# Patient Record
Sex: Female | Born: 1955
Health system: Southern US, Community
[De-identification: ages and names within clinical notes are randomized; demographics above are authoritative.]

## PROBLEM LIST (undated history)

## (undated) DIAGNOSIS — C801 Malignant (primary) neoplasm, unspecified: Secondary | ICD-10-CM

## (undated) DIAGNOSIS — Z806 Family history of leukemia: Secondary | ICD-10-CM

## (undated) DIAGNOSIS — H9190 Unspecified hearing loss, unspecified ear: Secondary | ICD-10-CM

## (undated) DIAGNOSIS — R112 Nausea with vomiting, unspecified: Secondary | ICD-10-CM

## (undated) DIAGNOSIS — J45909 Unspecified asthma, uncomplicated: Secondary | ICD-10-CM

## (undated) DIAGNOSIS — T7840XA Allergy, unspecified, initial encounter: Secondary | ICD-10-CM

## (undated) DIAGNOSIS — R519 Headache, unspecified: Secondary | ICD-10-CM

## (undated) DIAGNOSIS — R7303 Prediabetes: Secondary | ICD-10-CM

## (undated) DIAGNOSIS — I10 Essential (primary) hypertension: Secondary | ICD-10-CM

## (undated) DIAGNOSIS — H409 Unspecified glaucoma: Secondary | ICD-10-CM

## (undated) DIAGNOSIS — F32A Depression, unspecified: Secondary | ICD-10-CM

## (undated) DIAGNOSIS — F329 Major depressive disorder, single episode, unspecified: Secondary | ICD-10-CM

## (undated) DIAGNOSIS — Z8051 Family history of malignant neoplasm of kidney: Secondary | ICD-10-CM

## (undated) HISTORY — DX: Family history of leukemia: Z80.6

## (undated) HISTORY — DX: Depression, unspecified: F32.A

## (undated) HISTORY — DX: Unspecified glaucoma: H40.9

## (undated) HISTORY — DX: Family history of malignant neoplasm of kidney: Z80.51

## (undated) HISTORY — PX: TUBAL LIGATION: SHX77

## (undated) HISTORY — PX: APPENDECTOMY: SHX54

## (undated) HISTORY — DX: Allergy, unspecified, initial encounter: T78.40XA

## (undated) HISTORY — PX: EYE SURGERY: SHX253

## (undated) HISTORY — PX: OTHER SURGICAL HISTORY: SHX169

## (undated) HISTORY — DX: Unspecified hearing loss, unspecified ear: H91.90

## (undated) HISTORY — PX: TONSILLECTOMY: SUR1361

## (undated) HISTORY — DX: Unspecified asthma, uncomplicated: J45.909

## (undated) HISTORY — DX: Essential (primary) hypertension: I10

---
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
## 1998-01-06 ENCOUNTER — Other Ambulatory Visit: Admission: RE | Admit: 1998-01-06 | Discharge: 1998-01-06 | Payer: Self-pay | Admitting: Obstetrics and Gynecology

## 2017-09-25 DIAGNOSIS — K080 Exfoliation of teeth due to systemic causes: Secondary | ICD-10-CM | POA: Diagnosis not present

## 2018-04-02 DIAGNOSIS — K080 Exfoliation of teeth due to systemic causes: Secondary | ICD-10-CM | POA: Diagnosis not present

## 2018-06-02 DIAGNOSIS — Z23 Encounter for immunization: Secondary | ICD-10-CM | POA: Diagnosis not present

## 2018-09-15 DIAGNOSIS — K080 Exfoliation of teeth due to systemic causes: Secondary | ICD-10-CM | POA: Diagnosis not present

## 2018-10-07 DIAGNOSIS — K080 Exfoliation of teeth due to systemic causes: Secondary | ICD-10-CM | POA: Diagnosis not present

## 2019-04-13 DIAGNOSIS — K080 Exfoliation of teeth due to systemic causes: Secondary | ICD-10-CM | POA: Diagnosis not present

## 2019-07-14 DIAGNOSIS — J309 Allergic rhinitis, unspecified: Secondary | ICD-10-CM | POA: Diagnosis not present

## 2019-07-14 DIAGNOSIS — Z23 Encounter for immunization: Secondary | ICD-10-CM | POA: Diagnosis not present

## 2019-07-14 DIAGNOSIS — I10 Essential (primary) hypertension: Secondary | ICD-10-CM | POA: Diagnosis not present

## 2019-07-14 DIAGNOSIS — N950 Postmenopausal bleeding: Secondary | ICD-10-CM | POA: Diagnosis not present

## 2019-07-15 ENCOUNTER — Other Ambulatory Visit: Payer: Self-pay | Admitting: Family Medicine

## 2019-07-15 DIAGNOSIS — N950 Postmenopausal bleeding: Secondary | ICD-10-CM

## 2019-07-24 ENCOUNTER — Other Ambulatory Visit: Payer: Self-pay | Admitting: Family Medicine

## 2019-07-24 DIAGNOSIS — N950 Postmenopausal bleeding: Secondary | ICD-10-CM

## 2019-07-30 ENCOUNTER — Ambulatory Visit
Admission: RE | Admit: 2019-07-30 | Discharge: 2019-07-30 | Disposition: A | Discharge status: Home / Self Care | Payer: Federal, State, Local not specified - PPO | Source: Ambulatory Visit | Attending: Family Medicine | Admitting: Family Medicine

## 2019-07-30 DIAGNOSIS — N950 Postmenopausal bleeding: Secondary | ICD-10-CM | POA: Diagnosis not present

## 2019-08-06 DIAGNOSIS — C541 Malignant neoplasm of endometrium: Secondary | ICD-10-CM | POA: Diagnosis not present

## 2019-08-06 DIAGNOSIS — Z124 Encounter for screening for malignant neoplasm of cervix: Secondary | ICD-10-CM | POA: Diagnosis not present

## 2019-08-06 DIAGNOSIS — N950 Postmenopausal bleeding: Secondary | ICD-10-CM | POA: Diagnosis not present

## 2019-08-12 ENCOUNTER — Telehealth: Payer: Self-pay | Admitting: *Deleted

## 2019-08-12 NOTE — Telephone Encounter (Signed)
Called and scheduled the patient for a new patient appt for 11/30. Gave the patient information for no visitors and mask

## 2019-08-14 DIAGNOSIS — C541 Malignant neoplasm of endometrium: Secondary | ICD-10-CM

## 2019-08-17 ENCOUNTER — Other Ambulatory Visit: Payer: Self-pay

## 2019-08-17 ENCOUNTER — Encounter: Payer: Self-pay | Admitting: Gynecologic Oncology

## 2019-08-17 ENCOUNTER — Inpatient Hospital Stay: Payer: Federal, State, Local not specified - PPO | Attending: Gynecologic Oncology | Admitting: Gynecologic Oncology

## 2019-08-17 VITALS — BP 178/93 | HR 78 | Temp 99.3°F | Resp 18 | Ht 70.0 in | Wt 296.0 lb

## 2019-08-17 DIAGNOSIS — Z87891 Personal history of nicotine dependence: Secondary | ICD-10-CM | POA: Diagnosis not present

## 2019-08-17 DIAGNOSIS — C541 Malignant neoplasm of endometrium: Secondary | ICD-10-CM | POA: Diagnosis not present

## 2019-08-17 DIAGNOSIS — F329 Major depressive disorder, single episode, unspecified: Secondary | ICD-10-CM | POA: Diagnosis not present

## 2019-08-17 DIAGNOSIS — N852 Hypertrophy of uterus: Secondary | ICD-10-CM | POA: Diagnosis not present

## 2019-08-17 DIAGNOSIS — I10 Essential (primary) hypertension: Secondary | ICD-10-CM | POA: Insufficient documentation

## 2019-08-17 DIAGNOSIS — J45909 Unspecified asthma, uncomplicated: Secondary | ICD-10-CM | POA: Diagnosis not present

## 2019-08-17 DIAGNOSIS — Z79899 Other long term (current) drug therapy: Secondary | ICD-10-CM | POA: Diagnosis not present

## 2019-08-17 DIAGNOSIS — Z6841 Body Mass Index (BMI) 40.0 and over, adult: Secondary | ICD-10-CM | POA: Diagnosis not present

## 2019-08-17 DIAGNOSIS — Z1211 Encounter for screening for malignant neoplasm of colon: Secondary | ICD-10-CM | POA: Diagnosis not present

## 2019-08-17 MED ORDER — TRAMADOL HCL 50 MG PO TABS: 50.0000 mg | ORAL_TABLET | Freq: Every day | ORAL | 0 refills | Status: DC | PRN

## 2019-08-17 NOTE — Patient Instructions (Signed)
Preparing for your Surgery  Plan for surgery on September 28, 2019 with Dr. Everitt Amber at [place]. You will be scheduled for a robotic assisted total laparoscopic hysterectomy, bilateral salpingo-oophorectomy, sentinel lymph node biopsy.   Pre-operative Testing -You will receive a phone call from presurgical testing at [facility] if you have not received a call already to arrange for a pre-operative testing appointment before your surgery.  This appointment normally occurs one to two weeks before your scheduled surgery.   -Bring your insurance card, copy of an advanced directive if applicable, medication list  -At that visit, you will be asked to sign a consent for a possible blood transfusion in case a transfusion becomes necessary during surgery.  The need for a blood transfusion is rare but having consent is a necessary part of your care.     -You should not be taking blood thinners, aspirin, or ibuprofen at least ten days prior to surgery unless instructed by your surgeon.  -Do not take supplements such as fish oil (omega 3), red yeast rice, tumeric before your surgery.  Day Before Surgery at [place] will be asked to take in a light diet the day before surgery.  Avoid carbonated beverages.  You will be advised to have nothing to eat or drink after midnight the evening before.    Eat a light diet the day before surgery.  Examples including soups, broths, toast, yogurt, mashed potatoes.  Things to avoid include carbonated beverages (fizzy beverages), raw fruits and raw vegetables, or beans.   If your bowels are filled with gas, your surgeon will have difficulty visualizing your pelvic organs which increases your surgical risks.  Your role in recovery Your role is to become active as soon as directed by your doctor, while still giving yourself time to heal.  Rest when you feel tired. You will be asked to do the following in order to speed your  recovery:  - Cough and breathe deeply. This helps toclear and expand your lungs and can prevent pneumonia.  - Do mild physical activity. Walking or moving your legs help your circulation and body functions return to normal. A staff member will help you when you try to walk and will provide you with simple exercises. Do not try to get up or walk alone the first time. - Actively manage your pain. Managing your pain lets you move in comfort. We will ask you to rate your pain on a scale of zero to 10. It is your responsibility to tell your doctor or nurse where and how much you hurt so your pain can be treated.  Special Considerations -If you are diabetic, you may be placed on insulin after surgery to have closer control over your blood sugars to promote healing and recovery.  This does not mean that you will be discharged on insulin.  If applicable, your oral antidiabetics will be resumed when you are tolerating a solid diet.  -Your final pathology results from surgery should be available around one week after surgery and the results will be relayed to you when available.  -FMLA forms can be faxed to [phone] and please allow 5-7 business days for completion.  Blood Transfusion Information WHAT IS A BLOOD TRANSFUSION? A transfusion is the replacement of blood or some of its parts. Blood is made up of multiple cells which provide different functions.  Red blood cells carry oxygen and are used for blood loss replacement.  White blood cells  fight against infection.  Platelets control bleeding.  Plasma helps clot blood.  Other blood products are available for specialized needs, such as hemophilia or other clotting disorders. BEFORE THE TRANSFUSION  Who gives blood for transfusions?   You may be able to donate blood to be used at a later date on yourself (autologous donation).  Relatives can be asked to donate blood. This is generally not any safer than if you have received blood from a  stranger. The same precautions are taken to ensure safety when a relative's blood is donated.  Healthy volunteers who are fully evaluated to make sure their blood is safe. This is blood bank blood. Transfusion therapy is the safest it has ever been in the practice of medicine. Before blood is taken from a donor, a complete history is taken to make sure that person has no history of diseases nor engages in risky social behavior (examples are intravenous drug use or sexual activity with multiple partners). The donor's travel history is screened to minimize risk of transmitting infections, such as malaria. The donated blood is tested for signs of infectious diseases, such as HIV and hepatitis. The blood is then tested to be sure it is compatible with you in order to minimize the chance of a transfusion reaction. If you or a relative donates blood, this is often done in anticipation of surgery and is not appropriate for emergency situations. It takes many days to process the donated blood. RISKS AND COMPLICATIONS Although transfusion therapy is very safe and saves many lives, the main dangers of transfusion include:   Getting an infectious disease.  Developing a transfusion reaction. This is an allergic reaction to something in the blood you were given. Every precaution is taken to prevent this. The decision to have a blood transfusion has been considered carefully by your caregiver before blood is given. Blood is not given unless the benefits outweigh the risks.  AFTER SURGERY INSTRUCTIONS 08/17/2019  Return to work: 4-6 weeks if applicable  Activity: 1. Be up and out of the bed during the day.  Take a nap if needed.  You may walk up steps but be careful and use the hand rail.  Stair climbing will tire you more than you think, you may need to stop part way and rest.   2. No lifting or straining for 6 weeks.  3. No driving for 1 week(s).  Do not drive if you are taking narcotic pain medicine.  4.  Shower daily.  Use soap and water on your incision and pat dry; don't rub.  No tub baths until cleared by your surgeon.   5. No sexual activity and nothing in the vagina for 8 weeks.  6. You may experience a small amount of clear drainage from your incisions, which is normal.  If the drainage persists or increases, please call the office.  7. You may experience vaginal spotting after surgery or around the 6-8 week mark from surgery when the stitches at the top of the vagina begin to dissolve.  The spotting is normal but if you experience heavy bleeding, call our office.  8. Take Tylenol or ibuprofen first for pain and only use narcotic pain medication for severe pain not relieved by the Tylenol or Ibuprofen.  Monitor your Tylenol intake to a max of 4,000 mg.  Diet: 1. Low sodium Heart Healthy Diet is recommended.  2. It is safe to use a laxative, such as Miralax or Colace, if you have difficulty moving your  bowels. You can take Sennakot at bedtime every evening to keep bowel movements regular and to prevent constipation.    Wound Care: 1. Keep clean and dry.  Shower daily.  Reasons to call the Doctor:  Fever - Oral temperature greater than 100.4 degrees Fahrenheit  Foul-smelling vaginal discharge  Difficulty urinating  Nausea and vomiting  Increased pain at the site of the incision that is unrelieved with pain medicine.  Difficulty breathing with or without chest pain  New calf pain especially if only on one side  Sudden, continuing increased vaginal bleeding with or without clots.   Contacts: For questions or concerns you should contact:  Dr. Everitt Amber at [phone]  Joylene John, NP at [phone]  After Hours: call [phone] and have the GYN Oncologist paged/contacted

## 2019-08-17 NOTE — Addendum Note (Signed)
Addended by: Joylene John D on: 08/17/2019 12:19 PM   Modules accepted: Orders

## 2019-08-17 NOTE — Progress Notes (Signed)
Consult Note: Gyn-Onc  Consult was requested by Dr. Nelda Marseille for the evaluation of Sandra Hunt 63 y.o. female  CC:  Chief Complaint  Patient presents with  . Endometrial cancer [facility])    Assessment/Plan:  Sandra Hunt  is a 63 y.o.  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 She also has an enlarged uterus (11cm).  A detailed discussion was held with the patient and her family with regard to to her endometrial cancer diagnosis. We discussed the standard management options for uterine cancer which includes surgery followed possibly by adjuvant therapy depending on the results of surgery. The options for surgical management include a hysterectomy and removal of the tubes and ovaries possibly with removal of pelvic and para-aortic lymph nodes.If feasible, a minimally invasive approach including a robotic hysterectomy or laparoscopic hysterectomy have benefits including shorter hospital stay, recovery time and better wound healing than with open surgery. The patient has been counseled about these surgical options and the risks of surgery in general including infection, bleeding, damage to surrounding structures (including bowel, bladder, ureters, nerves or vessels), and the postoperative risks of PE/ DVT, and lymphedema. I extensively reviewed the additional risks of robotic hysterectomy including possible need for conversion to open laparotomy.  I discussed positioning during surgery of trendelenberg and risks of minor facial swelling and care we take in preoperative positioning.  After counseling and consideration of her options, she desires to proceed with robotic assisted total hysterectomy with bilateral sapingo-oophorectomy and SLN biopsy.   She may require a minilaparotomy for specimen delivery. We discussed this.  She will be seen by anesthesia for preoperative clearance and discussion of postoperative pain management.  She was  given the opportunity to ask questions, which were answered to her satisfaction, and she is agreement with the above mentioned plan of care.   HPI: Sandra Hunt is a 63 year old P4 who was seen in consultation at the request of Dr Nelda Marseille for evaluation of endometrial adenocarcinoma (FIGO grade 2).    The patient reported having menstrual-like cyclical bleeding for approximately 2 years beginning in 2018.  In April 2020 she then began passing heavy clots for approximately 3 months.  With subsequent lighter vaginal persistent daily bleeding.  She was eventually seen by Dr. Nelda Marseille who performed a transvaginal ultrasound scan on July 30, 2019 which revealed a uterus measuring 11.6 x 7.2 x 6.7 cm.  The endometrium was 6 mm in thickness.  The right and left ovaries were grossly normal.  Due to the endometrial thickening she underwent an office transvaginal biopsy on August 06, 2019.  This revealed FIGO grade 2 endometrioid adenocarcinoma.  The patient's obstetric history significant for 4 prior vaginal births.  She has had a tubal ligation and a diagnostic laparoscopy for what was thought to be a possible ectopic but was not.  Additionally her surgical history is remarkable for an open appendectomy.  She is morbidly obese with a BMI of 42 kg meters squared.  She has hypertension but no diabetes mellitus.  She is an ex-smoker but quit in 1983.  She has a family history significant for father with leukemia and a mother with kidney cancer.  She is married and lives with her husband who is of good health.  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  She helps with her brother and her mother with activities of daily living.  Current Meds:  Outpatient Encounter Medications as of 08/17/2019  Medication Sig  . acetaminophen (TYLENOL) 325 MG tablet Take 650 mg by mouth every 6 (six) hours as needed.  Marland Kitchen ibuprofen (ADVIL) 100 MG tablet Take 600 mg by mouth every 6 (six) hours as needed  for fever.  Marland Kitchen lisinopril (ZESTRIL) 10 MG tablet Take 10 mg by mouth daily.  Marland Kitchen PROAIR RESPICLICK 123XX123 (90 Base) MCG/ACT AEPB Inhale 1 puff into the lungs every 4 (four) hours as needed.  . traMADol (ULTRAM) 50 MG tablet Take 50 mg by mouth daily as needed.   No facility-administered encounter medications on file as of 08/17/2019.     Allergy:  Allergies  Allergen Reactions  . Sulfacetamide Sodium-Sulfur Rash    Social Hx:   Social History   Socioeconomic History  . Marital status: Single    Spouse name: Not on file  . Number of children: Not on file  . Years of education: Not on file  . Highest education level: Not on file  Occupational History  . Not on file  Social Needs  . Financial resource strain: Not on file  . Food insecurity    Worry: Not on file    Inability: Not on file  . Transportation needs    Medical: Not on file    Non-medical: Not on file  Tobacco Use  . Smoking status: Former Smoker    Quit date: 1983    Years since quitting: 37.9  . Smokeless tobacco: Never Used  Substance and Sexual Activity  . Alcohol use: Not Currently  . Drug use: Never  . Sexual activity: Yes    Birth control/protection: Other-see comments    Comment: tubal ligation  Lifestyle  . Physical activity    Days per week: Not on file    Minutes per session: Not on file  . Stress: Not on file  Relationships  . Social Herbalist on phone: Not on file    Gets together: Not on file    Attends religious service: Not on file    Active member of club or organization: Not on file    Attends meetings of clubs or organizations: Not on file    Relationship status: Not on file  . Intimate partner violence    Fear of current or ex partner: Not on file    Emotionally abused: Not on file    Physically abused: Not on file    Forced sexual activity: Not on file  Other Topics Concern  . Not on file  Social History Narrative  . Not on file    Past Surgical Hx:  Past Surgical  History:  Procedure Laterality Date  . APPENDECTOMY    . laporoscopy N/A   . TONSILLECTOMY Bilateral   . TUBAL LIGATION      Past Medical Hx:  Past Medical History:  Diagnosis Date  . Allergy   . Asthma   . Depression   . Glaucoma   . Hearing loss   . Hypertension     Past Gynecological History:  See HPI No LMP recorded.  Family Hx:  Family History  Problem Relation Age of Onset  . Leukemia Father   . Diabetes Father   . Diabetes Maternal Grandmother     Review of Systems:  Constitutional  Feels well,    ENT Normal appearing ears and nares bilaterally Skin/Breast  No rash, sores, jaundice, itching, dryness Cardiovascular  No chest pain, shortness of breath, or edema  Pulmonary  No cough or wheeze.  Gastro Intestinal  No nausea, vomitting, or diarrhoea. No bright red blood per rectum, no abdominal pain, change in bowel movement, or constipation.  Genito Urinary  No frequency, urgency, dysuria, + postmenopausal bleeding and uterine cramps Musculo Skeletal  No myalgia, arthralgia, joint swelling or pain  Neurologic  No weakness, numbness, change in gait,  Psychology  No depression, anxiety, insomnia.   Vitals:  Blood pressure (!) 178/93, pulse 78, temperature 99.3 F (37.4 C), temperature source Temporal, resp. rate 18, height 5\' 10"  (1.778 m), weight 296 lb (134.3 kg), SpO2 98 %.  Physical Exam: WD in NAD Neck  Supple NROM, without any enlargements.  Lymph Node Survey No cervical supraclavicular or inguinal adenopathy Cardiovascular  Pulse normal rate, regularity and rhythm. S1 and S2 normal.  Lungs  Clear to auscultation bilateraly, without wheezes/crackles/rhonchi. Good air movement.  Skin  No rash/lesions/breakdown  Psychiatry  Alert and oriented to person, place, and time  Abdomen  Normoactive bowel sounds, abdomen soft, non-tender and obese without evidence of hernia.  Back No CVA tenderness Genito Urinary  Vulva/vagina: Normal external  female genitalia.   No lesions. No discharge or bleeding.  Bladder/urethra:  No lesions or masses, well supported bladder  Vagina: grossly normal  Cervix: flush with cervix, lower uterine segment feels barrelled.   Uterus: Bulky, mobile, bulky widened lower uterine segment,no parametrial involvement or nodularity.  Adnexa: no palpable masses. Rectal  deferred Extremities  No bilateral cyanosis, clubbing or edema.   Thereasa Solo, MD  08/17/2019, 11:19 AM

## 2019-08-18 DIAGNOSIS — Z1211 Encounter for screening for malignant neoplasm of colon: Secondary | ICD-10-CM | POA: Diagnosis not present

## 2019-08-19 ENCOUNTER — Other Ambulatory Visit: Payer: Self-pay | Admitting: Gynecologic Oncology

## 2019-08-19 DIAGNOSIS — C541 Malignant neoplasm of endometrium: Secondary | ICD-10-CM

## 2019-09-14 ENCOUNTER — Telehealth: Payer: Self-pay | Admitting: *Deleted

## 2019-09-14 NOTE — Telephone Encounter (Signed)
Patient called regarding her COVID test, patient thought the test was two weeks before surgery along with a two week quarantine. Explained that she will receive a call the week before surgery for the COVID test and to quarantine after the test

## 2019-09-15 ENCOUNTER — Telehealth: Payer: Self-pay

## 2019-09-15 ENCOUNTER — Encounter: Payer: Self-pay | Admitting: Gynecologic Oncology

## 2019-09-15 NOTE — Telephone Encounter (Signed)
I called Sandra Hunt in response to her my chart message to Dr. Denman George.  She is having cramping, "like having a baby" with bleeding that waxes and wanes.  This is  cramping is the same in character as she had before her consultation with Dr. Denman George.  She is voiding without any s/s of UTI.  She is moving her bowels without difficulty.  She has been hesitant to take ibuprofen because of surgery.  Per Lenna Sciara, NP she can take the ibuprofen every 6 hours as needed until this coming Friday.  I encouraged her to use a heating pad to help with the cramping.  I told her she could go to the emergency room if the pain did not improve or got worse.  She verbalized understanding.

## 2019-09-21 ENCOUNTER — Other Ambulatory Visit (HOSPITAL_COMMUNITY): Payer: Self-pay | Admitting: *Deleted

## 2019-09-21 NOTE — Patient Instructions (Addendum)
DUE TO COVID-19 ONLY ONE VISITOR IS ALLOWED TO COME WITH YOU AND STAY IN THE WAITING ROOM ONLY DURING PRE OP AND PROCEDURE DAY OF SURGERY. THE 1 VISITOR MAY VISIT WITH YOU AFTER SURGERY IN YOUR PRIVATE ROOM DURING VISITING HOURS ONLY!  YOU NEED TO HAVE A COVID 19 TEST ON__Thursday 01/07/2021_____ @_1010  am______, THIS TEST MUST BE DONE BEFORE SURGERY, COME  [name] , [id number].  ([place]) ONCE YOUR COVID TEST IS COMPLETED, PLEASE BEGIN THE QUARANTINE INSTRUCTIONS AS OUTLINED IN YOUR HANDOUT.                Sandra Hunt     Your procedure is scheduled on: Monday 09/28/2019   Report to [facility] Main  Entrance    Report to admitting at   0700 AM     Call this number if you have problems the morning of surgery [phone]    Eat a light diet the day before surgery.  Examples including soups, broths, toast, yogurt, mashed potatoes.  Things to avoid include carbonated beverages (fizzy beverages), raw fruits and raw vegetables, or beans.    If your bowels are filled with gas, your surgeon will have difficulty visualizing your pelvic organs which increases your surgical risks.    You May have clear liquids from midnight up until 0600am then nothing until after surgery.   CLEAR LIQUID DIET   Foods Allowed                                                                     Foods Excluded  Coffee and tea, regular and decaf                             liquids that you cannot  Plain Jell-O any favor except red or purple                                           see through such as: Fruit ices (not with fruit pulp)                                     milk, soups, orange juice  Iced Popsicles                                    All solid food                                     Cranberry, grape and apple juices Sports drinks like Gatorade Lightly seasoned clear broth or consume(fat free) Sugar, honey syrup  Sample Menu Breakfast                                 Lunch  Supper Cranberry juice                    Beef broth                            Chicken broth Jell-O                                     Grape juice                           Apple juice Coffee or tea                        Jell-O                                      Popsicle                                                Coffee or tea                        Coffee or tea  _____________________________________________________________________     BRUSH YOUR TEETH MORNING OF SURGERY AND RINSE YOUR MOUTH OUT, NO CHEWING GUM CANDY OR MINTS.     Take these medicines the morning of surgery with A SIP OF WATER: use Proair Respiclick inhaler if needed and bring inhalers with you to the hospital.                                 You may not have any metal on your body including hair pins and              piercings  Do not wear jewelry, make-up, lotions, powders or perfumes, deodorant             Do not wear nail polish on your fingernails.  Do not shave  48 hours prior to surgery.                 Do not bring valuables to the hospital. [place].  Contacts, dentures or bridgework may not be worn into surgery.  Leave suitcase in the car. After surgery it may be brought to your room.     Patients discharged the day of surgery will not be allowed to drive home. IF YOU ARE HAVING SURGERY AND GOING HOME THE SAME DAY, YOU MUST HAVE AN ADULT TO DRIVE YOU HOME AND  BE WITH YOU FOR 24 HOURS. YOU MAY GO HOME BY TAXI OR UBER OR ORTHERWISE, BUT AN ADULT MUST ACCOMPANY YOU HOME AND STAY WITH YOU FOR 24 HOURS.  Name and phone number of your driver:spouseChristia Hunt  [phone]                Please read over the following fact sheets you were given: _____________________________________________________________________  [facility] - Preparing for Surgery Before surgery,  you can play an important role.  Because skin is not sterile, your skin needs to be as free of germs as possible.  You can reduce the number of germs on your skin by washing with CHG (chlorahexidine gluconate) soap before surgery.  CHG is an antiseptic cleaner which kills germs and bonds with the skin to continue killing germs even after washing. Please DO NOT use if you have an allergy to CHG or antibacterial soaps.  If your skin becomes reddened/irritated stop using the CHG and inform your nurse when you arrive at Short Stay. Do not shave (including legs and underarms) for at least 48 hours prior to the first CHG shower.  You may shave your face/neck. Please follow these instructions carefully:  1.  Shower with CHG Soap the night before surgery and the  morning of Surgery.  2.  If you choose to wash your hair, wash your hair first as usual with your  normal  shampoo.  3.  After you shampoo, rinse your hair and body thoroughly to remove the  shampoo.                           4.  Use CHG as you would any other liquid soap.  You can apply chg directly  to the skin and wash                       Gently with a scrungie or clean washcloth.  5.  Apply the CHG Soap to your body ONLY FROM THE NECK DOWN.   Do not use on face/ open                           Wound or open sores. Avoid contact with eyes, ears mouth and genitals (private parts).                       Wash face,  Genitals (private parts) with your normal soap.             6.  Wash thoroughly, paying special attention to the area where your surgery  will be performed.  7.  Thoroughly rinse your body with warm water from the neck down.  8.  DO NOT shower/wash with your normal soap after using and rinsing off  the CHG Soap.                9.  Pat yourself dry with a clean towel.            10.  Wear clean pajamas.            11.  Place clean sheets on your bed the night of your first shower and do not  sleep with pets. Day of Surgery : Do not  apply any lotions/deodorants the morning of surgery.  Please wear clean clothes to the hospital/surgery center.  FAILURE TO FOLLOW THESE INSTRUCTIONS MAY RESULT IN THE CANCELLATION OF YOUR SURGERY PATIENT SIGNATURE_________________________________  NURSE SIGNATURE__________________________________  ________________________________________________________________________   Adam Phenix  An incentive spirometer is a tool that can help keep your lungs clear and active. This tool measures how well you are filling your lungs with each breath. Taking long deep breaths may help reverse or decrease the chance of developing breathing (pulmonary) problems (especially infection) following:  A long period of  time when you are unable to move or be active. BEFORE THE PROCEDURE   If the spirometer includes an indicator to show your best effort, your nurse or respiratory therapist will set it to a desired goal.  If possible, sit up straight or lean slightly forward. Try not to slouch.  Hold the incentive spirometer in an upright position. INSTRUCTIONS FOR USE  1. Sit on the edge of your bed if possible, or sit up as far as you can in bed or on a chair. 2. Hold the incentive spirometer in an upright position. 3. Breathe out normally. 4. Place the mouthpiece in your mouth and seal your lips tightly around it. 5. Breathe in slowly and as deeply as possible, raising the piston or the ball toward the top of the column. 6. Hold your breath for 3-5 seconds or for as long as possible. Allow the piston or ball to fall to the bottom of the column. 7. Remove the mouthpiece from your mouth and breathe out normally. 8. Rest for a few seconds and repeat Steps 1 through 7 at least 10 times every 1-2 hours when you are awake. Take your time and take a few normal breaths between deep breaths. 9. The spirometer may include an indicator to show your best effort. Use the indicator as a goal to work toward during  each repetition. 10. After each set of 10 deep breaths, practice coughing to be sure your lungs are clear. If you have an incision (the cut made at the time of surgery), support your incision when coughing by placing a pillow or rolled up towels firmly against it. Once you are able to get out of bed, walk around indoors and cough well. You may stop using the incentive spirometer when instructed by your caregiver.  RISKS AND COMPLICATIONS  Take your time so you do not get dizzy or light-headed.  If you are in pain, you may need to take or ask for pain medication before doing incentive spirometry. It is harder to take a deep breath if you are having pain. AFTER USE  Rest and breathe slowly and easily.  It can be helpful to keep track of a log of your progress. Your caregiver can provide you with a simple table to help with this. If you are using the spirometer at home, follow these instructions: [place] IF:   You are having difficultly using the spirometer.  You have trouble using the spirometer as often as instructed.  Your pain medication is not giving enough relief while using the spirometer.  You develop fever of 100.5 F (38.1 C) or higher. SEEK IMMEDIATE MEDICAL CARE IF:   You cough up bloody sputum that had not been present before.  You develop fever of 102 F (38.9 C) or greater.  You develop worsening pain at or near the incision site. MAKE SURE YOU:   Understand these instructions.  Will watch your condition.  Will get help right away if you are not doing well or get worse. Document Released: 01/14/2007 Document Revised: 11/26/2011 Document Reviewed: 03/17/2007 Exit[employer] Patient Information 2014 [employer], [place].   ________________________________________________________________________  WHAT IS A BLOOD TRANSFUSION? Blood Transfusion Information  A transfusion is the replacement of blood or some of its parts. Blood is made up of multiple cells which  provide different functions.  Red blood cells carry oxygen and are used for blood loss replacement.  White blood cells fight against infection.  Platelets control bleeding.  Plasma helps clot blood.  Other blood products are available for specialized needs, such as hemophilia or other clotting disorders. BEFORE THE TRANSFUSION  Who gives blood for transfusions?   Healthy volunteers who are fully evaluated to make sure their blood is safe. This is blood bank blood. Transfusion therapy is the safest it has ever been in the practice of medicine. Before blood is taken from a donor, a complete history is taken to make sure that person has no history of diseases nor engages in risky social behavior (examples are intravenous drug use or sexual activity with multiple partners). The donor's travel history is screened to minimize risk of transmitting infections, such as malaria. The donated blood is tested for signs of infectious diseases, such as HIV and hepatitis. The blood is then tested to be sure it is compatible with you in order to minimize the chance of a transfusion reaction. If you or a relative donates blood, this is often done in anticipation of surgery and is not appropriate for emergency situations. It takes many days to process the donated blood. RISKS AND COMPLICATIONS Although transfusion therapy is very safe and saves many lives, the main dangers of transfusion include:   Getting an infectious disease.  Developing a transfusion reaction. This is an allergic reaction to something in the blood you were given. Every precaution is taken to prevent this. The decision to have a blood transfusion has been considered carefully by your caregiver before blood is given. Blood is not given unless the benefits outweigh the risks. AFTER THE TRANSFUSION  Right after receiving a blood transfusion, you will usually feel much better and more energetic. This is especially true if your red blood cells  have gotten low (anemic). The transfusion raises the level of the red blood cells which carry oxygen, and this usually causes an energy increase.  The nurse administering the transfusion will monitor you carefully for complications. HOME CARE INSTRUCTIONS  No special instructions are needed after a transfusion. You may find your energy is better. Speak with your caregiver about any limitations on activity for underlying diseases you may have. SEEK MEDICAL CARE IF:   Your condition is not improving after your transfusion.  You develop redness or irritation at the intravenous (IV) site. SEEK IMMEDIATE MEDICAL CARE IF:  Any of the following symptoms occur over the next 12 hours:  Shaking chills.  You have a temperature by mouth above 102 F (38.9 C), not controlled by medicine.  Chest, back, or muscle pain.  People around you feel you are not acting correctly or are confused.  Shortness of breath or difficulty breathing.  Dizziness and fainting.  You get a rash or develop hives.  You have a decrease in urine output.  Your urine turns a dark color or changes to pink, red, or brown. Any of the following symptoms occur over the next 10 days:  You have a temperature by mouth above 102 F (38.9 C), not controlled by medicine.  Shortness of breath.  Weakness after normal activity.  The white part of the eye turns yellow (jaundice).  You have a decrease in the amount of urine or are urinating less often.  Your urine turns a dark color or changes to pink, red, or brown. Document Released: 08/31/2000 Document Revised: 11/26/2011 Document Reviewed: 04/19/2008 [facility] Patient Information 2014 [place], [place].  _______________________________________________________________________

## 2019-09-22 ENCOUNTER — Other Ambulatory Visit: Payer: Self-pay

## 2019-09-22 ENCOUNTER — Encounter (HOSPITAL_COMMUNITY)
Admission: RE | Admit: 2019-09-22 | Discharge: 2019-09-22 | Disposition: A | Discharge status: Home / Self Care | Payer: Federal, State, Local not specified - PPO | Source: Ambulatory Visit | Attending: Gynecologic Oncology | Admitting: Gynecologic Oncology

## 2019-09-22 ENCOUNTER — Encounter (HOSPITAL_COMMUNITY): Payer: Self-pay

## 2019-09-22 DIAGNOSIS — I10 Essential (primary) hypertension: Secondary | ICD-10-CM | POA: Diagnosis not present

## 2019-09-22 DIAGNOSIS — Z01812 Encounter for preprocedural laboratory examination: Secondary | ICD-10-CM | POA: Insufficient documentation

## 2019-09-22 DIAGNOSIS — Z01818 Encounter for other preprocedural examination: Secondary | ICD-10-CM | POA: Insufficient documentation

## 2019-09-22 DIAGNOSIS — R7303 Prediabetes: Secondary | ICD-10-CM | POA: Diagnosis not present

## 2019-09-22 HISTORY — DX: Headache, unspecified: R51.9

## 2019-09-22 HISTORY — DX: Malignant (primary) neoplasm, unspecified: C80.1

## 2019-09-22 HISTORY — DX: Prediabetes: R73.03

## 2019-09-22 HISTORY — DX: Other specified postprocedural states: R11.2

## 2019-09-22 LAB — COMPREHENSIVE METABOLIC PANEL
ALT: 35 U/L (ref 0–44)
AST: 38 U/L (ref 15–41)
Albumin: 4.6 g/dL (ref 3.5–5.0)
Alkaline Phosphatase: 57 U/L (ref 38–126)
Anion gap: 13 (ref 5–15)
BUN: 17 mg/dL (ref 8–23)
CO2: 24 mmol/L (ref 22–32)
Calcium: 9.5 mg/dL (ref 8.9–10.3)
Chloride: 102 mmol/L (ref 98–111)
Creatinine, Ser: 1.09 mg/dL — ABNORMAL HIGH (ref 0.44–1.00)
GFR calc Af Amer: >60 mL/min (ref >60)
GFR calc non Af Amer: 55 mL/min — ABNORMAL LOW (ref >60)
Glucose, Bld: 149 mg/dL — ABNORMAL HIGH (ref 70–99)
Potassium: 3.9 mmol/L (ref 3.5–5.1)
Sodium: 139 mmol/L (ref 135–145)
Total Bilirubin: 0.8 mg/dL (ref 0.3–1.2)
Total Protein: 7.8 g/dL (ref 6.5–8.1)

## 2019-09-22 LAB — CBC
HCT: 44.5 % (ref 36.0–46.0)
Hemoglobin: 14.2 g/dL (ref 12.0–15.0)
MCH: 30.9 pg (ref 26.0–34.0)
MCHC: 31.9 g/dL (ref 30.0–36.0)
MCV: 96.7 fL (ref 80.0–100.0)
Platelets: 377 10*3/uL (ref 150–400)
RBC: 4.6 MIL/uL (ref 3.87–5.11)
RDW: 14.6 % (ref 11.5–15.5)
WBC: 8.6 10*3/uL (ref 4.0–10.5)
nRBC: 0 % (ref 0.0–0.2)

## 2019-09-22 LAB — ABO/RH: ABO/RH(D): AB POS

## 2019-09-22 LAB — HEMOGLOBIN A1C
Hgb A1c MFr Bld: 5.7 % — ABNORMAL HIGH (ref 4.8–5.6)
Mean Plasma Glucose: 116.89 mg/dL

## 2019-09-22 NOTE — Progress Notes (Signed)
PCP - Dr. Donald Prose Cardiologist - n/a  Chest x-ray - n/a EKG - 09/22/2019 EPIC Stress Test - n/a ECHO - n/a Cardiac Cath - n/a  Sleep Study - n/a CPAP - n/a  Fasting Blood Sugar - Pre-Diabetes Checks Blood Sugar __0___ times a day  Blood Thinner Instructions:n/a Aspirin Instructions:n/a Last Dose:Takes Advil as needed and Last dose was 09/17/2019.  Anesthesia review:  Chart to Konrad Felix, PA to review medical history and see patient per Dr. Everitt Amber and talk about pain control after surgery.  Patient has a history of HTN, Pre-Diabetes and Endometrial cancer.  Patient denies shortness of breath, fever, cough and chest pain at PAT appointment   Patient verbalized understanding of instructions that were given to them at the PAT appointment. Patient was also instructed that they will need to review over the PAT instructions again at home before surgery.

## 2019-09-23 ENCOUNTER — Encounter (HOSPITAL_COMMUNITY): Payer: Self-pay

## 2019-09-23 NOTE — Progress Notes (Signed)
Anesthesia Chart Review  Pt requested to speak with anesthesia during PAT visit.  She reports she has experienced severe post op nausea and vomiting post operatively in the past and is concerned about PONV with this procedure.  She reports n/v with opioids.  She will discuss with anesthesia DOS as well.  I have added this to anesthesia history.   Maia Plan [facility] Pre-Surgical Testing [phone] 09/23/19  3:32 PM

## 2019-09-24 ENCOUNTER — Other Ambulatory Visit (HOSPITAL_COMMUNITY)
Admission: RE | Admit: 2019-09-24 | Discharge: 2019-09-24 | Disposition: A | Discharge status: Home / Self Care | Payer: Federal, State, Local not specified - PPO | Source: Ambulatory Visit | Attending: Gynecologic Oncology | Admitting: Gynecologic Oncology

## 2019-09-24 DIAGNOSIS — Z01812 Encounter for preprocedural laboratory examination: Secondary | ICD-10-CM | POA: Diagnosis not present

## 2019-09-24 DIAGNOSIS — Z20822 Contact with and (suspected) exposure to covid-19: Secondary | ICD-10-CM | POA: Insufficient documentation

## 2019-09-25 ENCOUNTER — Telehealth: Payer: Self-pay

## 2019-09-25 NOTE — Progress Notes (Signed)
Louise from GYN Oncology called to verify the time of surgery, as off 09/25/2019 1500 the surgery was scheduled for 09/28/2019 at 9:00 AM.

## 2019-09-25 NOTE — Telephone Encounter (Signed)
Spoke with Ms Fero and she understands that she needs to report to admitting on 09-28-19 at 0700 as surgery is at [place], [place].

## 2019-09-26 LAB — NOVEL CORONAVIRUS, NAA (HOSP ORDER, SEND-OUT TO REF LAB; TAT 18-24 HRS): SARS-CoV-2, NAA: NOT DETECTED

## 2019-09-27 MED ORDER — DEXTROSE 5 % IV SOLN
3.0000 g | INTRAVENOUS | Status: AC
  Administered 2019-09-28: 3 g via INTRAVENOUS
  Filled 2019-09-27: qty 3

## 2019-09-27 NOTE — Anesthesia Preprocedure Evaluation (Addendum)
Anesthesia Evaluation  Patient identified by MRN, date of birth, ID band Patient awake    Reviewed: Allergy & Precautions, NPO status , Patient's Chart, lab work & pertinent test results  History of Anesthesia Complications (+) PONV  Airway Mallampati: II  TM Distance: >3 FB Neck ROM: Full    Dental no notable dental hx. (+) Chipped, Dental Advisory Given,    Pulmonary asthma , former smoker,    Pulmonary exam normal breath sounds clear to auscultation       Cardiovascular hypertension, negative cardio ROS Normal cardiovascular exam Rhythm:Regular Rate:Normal     Neuro/Psych  Headaches, PSYCHIATRIC DISORDERS Depression    GI/Hepatic negative GI ROS, Neg liver ROS,   Endo/Other  Morbid obesity  Renal/GU negative Renal ROS  negative genitourinary   Musculoskeletal negative musculoskeletal ROS (+)   Abdominal   Peds  Hematology negative hematology ROS (+)   Anesthesia Other Findings Endometrial CA  Reproductive/Obstetrics                            Anesthesia Physical Anesthesia Plan  ASA: III  Anesthesia Plan: General   Post-op Pain Management:    Induction: Intravenous  PONV Risk Score and Plan: 4 or greater and Midazolam, Dexamethasone, Ondansetron and Scopolamine patch - Pre-op  Airway Management Planned: Oral ETT  Additional Equipment:   Intra-op Plan:   Post-operative Plan: Extubation in OR  Informed Consent: I have reviewed the patients History and Physical, chart, labs and discussed the procedure including the risks, benefits and alternatives for the proposed anesthesia with the patient or authorized representative who has indicated his/her understanding and acceptance.     Dental advisory given  Plan Discussed with: CRNA  Anesthesia Plan Comments:         Anesthesia Quick Evaluation

## 2019-09-28 ENCOUNTER — Ambulatory Visit (HOSPITAL_COMMUNITY)
Admission: RE | Admit: 2019-09-28 | Discharge: 2019-09-28 | Disposition: A | Discharge status: Home / Self Care | Payer: Federal, State, Local not specified - PPO | Source: Other Acute Inpatient Hospital | Attending: Gynecologic Oncology | Admitting: Gynecologic Oncology

## 2019-09-28 ENCOUNTER — Encounter (HOSPITAL_COMMUNITY)
Admission: RE | Disposition: A | Discharge status: Home / Self Care | Payer: Self-pay | Source: Other Acute Inpatient Hospital | Attending: Gynecologic Oncology

## 2019-09-28 ENCOUNTER — Other Ambulatory Visit: Payer: Self-pay

## 2019-09-28 ENCOUNTER — Encounter (HOSPITAL_COMMUNITY): Payer: Self-pay | Admitting: Gynecologic Oncology

## 2019-09-28 ENCOUNTER — Ambulatory Visit (HOSPITAL_COMMUNITY): Payer: Federal, State, Local not specified - PPO | Admitting: Anesthesiology

## 2019-09-28 ENCOUNTER — Ambulatory Visit (HOSPITAL_COMMUNITY): Payer: Federal, State, Local not specified - PPO | Admitting: Physician Assistant

## 2019-09-28 DIAGNOSIS — Z87891 Personal history of nicotine dependence: Secondary | ICD-10-CM | POA: Diagnosis not present

## 2019-09-28 DIAGNOSIS — N800 Endometriosis of uterus: Secondary | ICD-10-CM | POA: Insufficient documentation

## 2019-09-28 DIAGNOSIS — C541 Malignant neoplasm of endometrium: Secondary | ICD-10-CM | POA: Insufficient documentation

## 2019-09-28 DIAGNOSIS — Z6841 Body Mass Index (BMI) 40.0 and over, adult: Secondary | ICD-10-CM | POA: Insufficient documentation

## 2019-09-28 DIAGNOSIS — Z8051 Family history of malignant neoplasm of kidney: Secondary | ICD-10-CM | POA: Diagnosis not present

## 2019-09-28 DIAGNOSIS — I10 Essential (primary) hypertension: Secondary | ICD-10-CM | POA: Insufficient documentation

## 2019-09-28 DIAGNOSIS — Z806 Family history of leukemia: Secondary | ICD-10-CM | POA: Diagnosis not present

## 2019-09-28 DIAGNOSIS — J45909 Unspecified asthma, uncomplicated: Secondary | ICD-10-CM | POA: Insufficient documentation

## 2019-09-28 DIAGNOSIS — F329 Major depressive disorder, single episode, unspecified: Secondary | ICD-10-CM | POA: Diagnosis not present

## 2019-09-28 HISTORY — PX: SENTINEL NODE BIOPSY: SHX6608

## 2019-09-28 HISTORY — PX: ROBOTIC ASSISTED TOTAL HYSTERECTOMY WITH BILATERAL SALPINGO OOPHERECTOMY: SHX6086

## 2019-09-28 LAB — GLUCOSE, CAPILLARY
Glucose-Capillary: 130 mg/dL — ABNORMAL HIGH (ref 70–99)
Glucose-Capillary: 148 mg/dL — ABNORMAL HIGH (ref 70–99)

## 2019-09-28 LAB — TYPE AND SCREEN
ABO/RH(D): AB POS
Antibody Screen: NEGATIVE

## 2019-09-28 SURGERY — HYSTERECTOMY, TOTAL, ROBOT-ASSISTED, LAPAROSCOPIC, WITH BILATERAL SALPINGO-OOPHORECTOMY
Anesthesia: General

## 2019-09-28 MED ORDER — KETAMINE HCL 10 MG/ML IJ SOLN
INTRAMUSCULAR | Status: AC
  Filled 2019-09-28: qty 1

## 2019-09-28 MED ORDER — DEXAMETHASONE SODIUM PHOSPHATE 4 MG/ML IJ SOLN: 4.0000 mg | INTRAMUSCULAR | Status: DC

## 2019-09-28 MED ORDER — LIDOCAINE 2% (20 MG/ML) 5 ML SYRINGE
INTRAMUSCULAR | Status: DC | PRN
  Administered 2019-09-28: 100 mg via INTRAVENOUS

## 2019-09-28 MED ORDER — LIDOCAINE 2% (20 MG/ML) 5 ML SYRINGE
INTRAMUSCULAR | Status: AC
  Filled 2019-09-28: qty 5

## 2019-09-28 MED ORDER — LIDOCAINE HCL 2 % IJ SOLN
INTRAMUSCULAR | Status: AC
  Filled 2019-09-28: qty 20

## 2019-09-28 MED ORDER — LACTATED RINGERS IV SOLN: INTRAVENOUS | Status: DC

## 2019-09-28 MED ORDER — FENTANYL CITRATE (PF) 100 MCG/2ML IJ SOLN
INTRAMUSCULAR | Status: AC
  Filled 2019-09-28: qty 2

## 2019-09-28 MED ORDER — ROCURONIUM BROMIDE 10 MG/ML (PF) SYRINGE
PREFILLED_SYRINGE | INTRAVENOUS | Status: AC
  Filled 2019-09-28: qty 10

## 2019-09-28 MED ORDER — BUPIVACAINE HCL (PF) 0.25 % IJ SOLN
INTRAMUSCULAR | Status: AC
  Filled 2019-09-28: qty 30

## 2019-09-28 MED ORDER — BUPIVACAINE LIPOSOME 1.3 % IJ SUSP
20.0000 mL | Freq: Once | INTRAMUSCULAR | Status: AC
  Administered 2019-09-28: 11:00:00 20 mL
  Filled 2019-09-28: qty 20

## 2019-09-28 MED ORDER — KETAMINE HCL 10 MG/ML IJ SOLN
INTRAMUSCULAR | Status: DC | PRN
  Administered 2019-09-28: 40 mg via INTRAVENOUS

## 2019-09-28 MED ORDER — PROPOFOL 10 MG/ML IV BOLUS
INTRAVENOUS | Status: DC | PRN
  Administered 2019-09-28: 130 mg via INTRAVENOUS

## 2019-09-28 MED ORDER — SODIUM CHLORIDE (PF) 0.9 % IJ SOLN
INTRAMUSCULAR | Status: AC
  Filled 2019-09-28: qty 10

## 2019-09-28 MED ORDER — SUCCINYLCHOLINE CHLORIDE 200 MG/10ML IV SOSY
PREFILLED_SYRINGE | INTRAVENOUS | Status: AC
  Filled 2019-09-28: qty 10

## 2019-09-28 MED ORDER — DEXAMETHASONE SODIUM PHOSPHATE 10 MG/ML IJ SOLN
INTRAMUSCULAR | Status: DC | PRN
  Administered 2019-09-28: 7 mg via INTRAVENOUS

## 2019-09-28 MED ORDER — LACTATED RINGERS IV SOLN: INTRAVENOUS | Status: DC | PRN

## 2019-09-28 MED ORDER — MIDAZOLAM HCL 2 MG/2ML IJ SOLN
INTRAMUSCULAR | Status: AC
  Filled 2019-09-28: qty 2

## 2019-09-28 MED ORDER — STERILE WATER FOR INJECTION IJ SOLN
INTRAMUSCULAR | Status: AC
  Filled 2019-09-28: qty 10

## 2019-09-28 MED ORDER — FENTANYL CITRATE (PF) 100 MCG/2ML IJ SOLN
25.0000 ug | INTRAMUSCULAR | Status: DC | PRN
  Administered 2019-09-28 (×2): 50 ug via INTRAVENOUS

## 2019-09-28 MED ORDER — ONDANSETRON HCL 4 MG/2ML IJ SOLN
INTRAMUSCULAR | Status: AC
  Filled 2019-09-28: qty 2

## 2019-09-28 MED ORDER — PHENYLEPHRINE 40 MCG/ML (10ML) SYRINGE FOR IV PUSH (FOR BLOOD PRESSURE SUPPORT)
PREFILLED_SYRINGE | INTRAVENOUS | Status: DC | PRN
  Administered 2019-09-28 (×3): 120 ug via INTRAVENOUS

## 2019-09-28 MED ORDER — FENTANYL CITRATE (PF) 250 MCG/5ML IJ SOLN
INTRAMUSCULAR | Status: AC
  Filled 2019-09-28: qty 5

## 2019-09-28 MED ORDER — ACETAMINOPHEN 500 MG PO TABS: 1000.0000 mg | ORAL_TABLET | Freq: Once | ORAL | Status: DC

## 2019-09-28 MED ORDER — LACTATED RINGERS IR SOLN
Status: DC | PRN
  Administered 2019-09-28: 1000 mL

## 2019-09-28 MED ORDER — SUGAMMADEX SODIUM 500 MG/5ML IV SOLN
INTRAVENOUS | Status: AC
  Filled 2019-09-28: qty 5

## 2019-09-28 MED ORDER — GABAPENTIN 300 MG PO CAPS
300.0000 mg | ORAL_CAPSULE | ORAL | Status: AC
  Administered 2019-09-28: 07:00:00 300 mg via ORAL
  Filled 2019-09-28: qty 1

## 2019-09-28 MED ORDER — FENTANYL CITRATE (PF) 250 MCG/5ML IJ SOLN
INTRAMUSCULAR | Status: DC | PRN
  Administered 2019-09-28: 50 ug via INTRAVENOUS
  Administered 2019-09-28: 100 ug via INTRAVENOUS
  Administered 2019-09-28 (×2): 50 ug via INTRAVENOUS

## 2019-09-28 MED ORDER — PROPOFOL 10 MG/ML IV BOLUS
INTRAVENOUS | Status: AC
  Filled 2019-09-28: qty 20

## 2019-09-28 MED ORDER — SUGAMMADEX SODIUM 200 MG/2ML IV SOLN
INTRAVENOUS | Status: DC | PRN
  Administered 2019-09-28: 300 mg via INTRAVENOUS

## 2019-09-28 MED ORDER — SCOPOLAMINE 1 MG/3DAYS TD PT72
1.0000 | MEDICATED_PATCH | TRANSDERMAL | Status: DC
  Administered 2019-09-28: 08:00:00 1.5 mg via TRANSDERMAL
  Filled 2019-09-28: qty 1

## 2019-09-28 MED ORDER — ACETAMINOPHEN 500 MG PO TABS
1000.0000 mg | ORAL_TABLET | ORAL | Status: AC
  Administered 2019-09-28: 07:00:00 1000 mg via ORAL
  Filled 2019-09-28: qty 2

## 2019-09-28 MED ORDER — OXYCODONE HCL 5 MG PO TABS: 5.0000 mg | ORAL_TABLET | ORAL | 0 refills | Status: DC | PRN

## 2019-09-28 MED ORDER — ONDANSETRON HCL 4 MG/2ML IJ SOLN
INTRAMUSCULAR | Status: DC | PRN
  Administered 2019-09-28: 4 mg via INTRAVENOUS

## 2019-09-28 MED ORDER — STERILE WATER FOR IRRIGATION IR SOLN
Status: DC | PRN
  Administered 2019-09-28: 1000 mL

## 2019-09-28 MED ORDER — ENOXAPARIN SODIUM 40 MG/0.4ML ~~LOC~~ SOLN
40.0000 mg | SUBCUTANEOUS | Status: AC
  Administered 2019-09-28: 08:00:00 40 mg via SUBCUTANEOUS
  Filled 2019-09-28: qty 0.4

## 2019-09-28 MED ORDER — PHENYLEPHRINE HCL-NACL 10-0.9 MG/250ML-% IV SOLN
INTRAVENOUS | Status: DC | PRN
  Administered 2019-09-28: 35 ug/min via INTRAVENOUS

## 2019-09-28 MED ORDER — VASOPRESSIN 20 UNIT/ML IV SOLN
INTRAVENOUS | Status: AC
  Filled 2019-09-28: qty 1

## 2019-09-28 MED ORDER — ROCURONIUM BROMIDE 50 MG/5ML IV SOSY
PREFILLED_SYRINGE | INTRAVENOUS | Status: DC | PRN
  Administered 2019-09-28: 100 mg via INTRAVENOUS
  Administered 2019-09-28: 20 mg via INTRAVENOUS

## 2019-09-28 MED ORDER — GABAPENTIN 300 MG PO CAPS
ORAL_CAPSULE | ORAL | Status: AC
  Filled 2019-09-28: qty 1

## 2019-09-28 MED ORDER — DEXAMETHASONE SODIUM PHOSPHATE 10 MG/ML IJ SOLN
INTRAMUSCULAR | Status: AC
  Filled 2019-09-28: qty 1

## 2019-09-28 MED ORDER — LIDOCAINE 2% (20 MG/ML) 5 ML SYRINGE
INTRAMUSCULAR | Status: DC | PRN
  Administered 2019-09-28: 1.5 mg/kg/h via INTRAVENOUS

## 2019-09-28 MED ORDER — PHENYLEPHRINE HCL (PRESSORS) 10 MG/ML IV SOLN
INTRAVENOUS | Status: AC
  Filled 2019-09-28: qty 1

## 2019-09-28 MED ORDER — SUGAMMADEX SODIUM 200 MG/2ML IV SOLN: INTRAVENOUS | Status: DC | PRN

## 2019-09-28 MED ORDER — MIDAZOLAM HCL 5 MG/5ML IJ SOLN
INTRAMUSCULAR | Status: DC | PRN
  Administered 2019-09-28: 2 mg via INTRAVENOUS

## 2019-09-28 MED ORDER — SENNOSIDES-DOCUSATE SODIUM 8.6-50 MG PO TABS: 2.0000 | ORAL_TABLET | Freq: Every day | ORAL | 1 refills | Status: DC

## 2019-09-28 SURGICAL SUPPLY — 76 items
ADH SKN CLS APL DERMABOND .7 (GAUZE/BANDAGES/DRESSINGS) ×1
AGENT HMST KT MTR STRL THRMB (HEMOSTASIS)
APL ESCP 34 STRL LF DISP (HEMOSTASIS)
APL PRP STRL LF DISP 70% ISPRP (MISCELLANEOUS) ×1
APPLICATOR SURGIFLO ENDO (HEMOSTASIS) IMPLANT
BACTOSHIELD CHG 4% 4OZ (MISCELLANEOUS) ×1
BAG LAPAROSCOPIC 12 15 PORT 16 (BASKET) IMPLANT
BAG RETRIEVAL 12/15 (BASKET) ×2
BAG SPEC RTRVL LRG 6X4 10 (ENDOMECHANICALS)
BLADE SURG SZ10 CARB STEEL (BLADE) IMPLANT
CHLORAPREP W/TINT 26 (MISCELLANEOUS) ×1 IMPLANT
COVER BACK TABLE 60X90IN (DRAPES) ×2 IMPLANT
COVER TIP SHEARS 8 DVNC (MISCELLANEOUS) ×1 IMPLANT
COVER TIP SHEARS 8MM DA VINCI (MISCELLANEOUS) ×1
COVER WAND RF STERILE (DRAPES) IMPLANT
DECANTER SPIKE VIAL GLASS SM (MISCELLANEOUS) IMPLANT
DERMABOND ADVANCED (GAUZE/BANDAGES/DRESSINGS) ×1
DERMABOND ADVANCED .7 DNX12 (GAUZE/BANDAGES/DRESSINGS) ×1 IMPLANT
DRAPE ARM DVNC X/XI (DISPOSABLE) ×4 IMPLANT
DRAPE COLUMN DVNC XI (DISPOSABLE) ×1 IMPLANT
DRAPE DA VINCI XI ARM (DISPOSABLE) ×4
DRAPE DA VINCI XI COLUMN (DISPOSABLE) ×1
DRAPE SHEET LG 3/4 BI-LAMINATE (DRAPES) ×2 IMPLANT
DRAPE SURG IRRIG POUCH 19X23 (DRAPES) ×2 IMPLANT
DRSG OPSITE POSTOP 4X6 (GAUZE/BANDAGES/DRESSINGS) ×1 IMPLANT
DRSG OPSITE POSTOP 4X8 (GAUZE/BANDAGES/DRESSINGS) IMPLANT
ELECT REM PT RETURN 15FT ADLT (MISCELLANEOUS) ×2 IMPLANT
GLOVE BIO SURGEON STRL SZ 6 (GLOVE) ×8 IMPLANT
GLOVE BIO SURGEON STRL SZ 6.5 (GLOVE) IMPLANT
GOWN STRL REUS W/ TWL LRG LVL3 (GOWN DISPOSABLE) ×4 IMPLANT
GOWN STRL REUS W/TWL LRG LVL3 (GOWN DISPOSABLE) ×8
HOLDER FOLEY CATH W/STRAP (MISCELLANEOUS) ×2 IMPLANT
IRRIG SUCT STRYKERFLOW 2 WTIP (MISCELLANEOUS) ×2
IRRIGATION SUCT STRKRFLW 2 WTP (MISCELLANEOUS) ×1 IMPLANT
KIT PROCEDURE DA VINCI SI (MISCELLANEOUS)
KIT PROCEDURE DVNC SI (MISCELLANEOUS) IMPLANT
KIT TURNOVER KIT A (KITS) IMPLANT
MANIPULATOR UTERINE 4.5 ZUMI (MISCELLANEOUS) ×2 IMPLANT
NDL SPNL 18GX3.5 QUINCKE PK (NEEDLE) IMPLANT
NEEDLE HYPO 22GX1.5 SAFETY (NEEDLE) IMPLANT
NEEDLE SPNL 18GX3.5 QUINCKE PK (NEEDLE) IMPLANT
OBTURATOR OPTICAL STANDARD 8MM (TROCAR) ×1
OBTURATOR OPTICAL STND 8 DVNC (TROCAR) ×1
OBTURATOR OPTICALSTD 8 DVNC (TROCAR) ×1 IMPLANT
PACK ROBOT GYN CUSTOM WL (TRAY / TRAY PROCEDURE) ×2 IMPLANT
PAD POSITIONING PINK XL (MISCELLANEOUS) ×2 IMPLANT
PENCIL ELECTRO RS 15 CORD (MISCELLANEOUS) ×1 IMPLANT
PENCIL SMOKE EVACUATOR (MISCELLANEOUS) IMPLANT
PORT ACCESS TROCAR AIRSEAL 12 (TROCAR) ×1 IMPLANT
PORT ACCESS TROCAR AIRSEAL 5M (TROCAR) ×1
POUCH SPECIMEN RETRIEVAL 10MM (ENDOMECHANICALS) IMPLANT
SCRUB CHG 4% DYNA-HEX 4OZ (MISCELLANEOUS) ×1 IMPLANT
SEAL CANN UNIV 5-8 DVNC XI (MISCELLANEOUS) ×3 IMPLANT
SEAL XI 5MM-8MM UNIVERSAL (MISCELLANEOUS) ×3
SET TRI-LUMEN FLTR TB AIRSEAL (TUBING) ×2 IMPLANT
SPONGE LAP 18X18 RF (DISPOSABLE) ×1 IMPLANT
SPONGE LAP 18X18 X RAY DECT (DISPOSABLE) IMPLANT
SURGIFLO W/THROMBIN 8M KIT (HEMOSTASIS) IMPLANT
SUT MNCRL AB 4-0 PS2 18 (SUTURE) ×1 IMPLANT
SUT PDS AB 1 TP1 96 (SUTURE) ×2 IMPLANT
SUT VIC AB 0 CT1 27 (SUTURE) ×4
SUT VIC AB 0 CT1 27XBRD ANTBC (SUTURE) IMPLANT
SUT VIC AB 2-0 CT1 27 (SUTURE)
SUT VIC AB 2-0 CT1 TAPERPNT 27 (SUTURE) IMPLANT
SUT VIC AB 3-0 CTX 36 (SUTURE) ×1 IMPLANT
SUT VIC AB 4-0 PS2 18 (SUTURE) ×1 IMPLANT
SUT VICRYL 4-0 PS2 18IN ABS (SUTURE) ×4 IMPLANT
SYR 10ML LL (SYRINGE) IMPLANT
SYR 20ML LL LF (SYRINGE) ×1 IMPLANT
TOWEL OR NON WOVEN STRL DISP B (DISPOSABLE) ×2 IMPLANT
TRAP SPECIMEN MUCOUS 40CC (MISCELLANEOUS) IMPLANT
TRAY FOLEY MTR SLVR 16FR STAT (SET/KITS/TRAYS/PACK) ×2 IMPLANT
TROCAR XCEL NON-BLD 5MMX100MML (ENDOMECHANICALS) IMPLANT
UNDERPAD 30X36 HEAVY ABSORB (UNDERPADS AND DIAPERS) ×2 IMPLANT
WATER STERILE IRR 1000ML POUR (IV SOLUTION) ×2 IMPLANT
YANKAUER SUCT BULB TIP 10FT TU (MISCELLANEOUS) ×1 IMPLANT

## 2019-09-28 NOTE — H&P (Signed)
H&P Note: Gyn-Onc  Consult was requested by Dr. Nelda Marseille for the evaluation of Sandra Hunt 64 y.o. female  CC:  Endometrial cancer, morbid obesity  Assessment/Plan:  Sandra Hunt  is a 64 y.o.  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 She also has an enlarged uterus (11cm).  A detailed discussion was held with the patient and her family with regard to to her endometrial cancer diagnosis. We discussed the standard management options for uterine cancer which includes surgery followed possibly by adjuvant therapy depending on the results of surgery. The options for surgical management include a hysterectomy and removal of the tubes and ovaries possibly with removal of pelvic and para-aortic lymph nodes.If feasible, a minimally invasive approach including a robotic hysterectomy or laparoscopic hysterectomy have benefits including shorter hospital stay, recovery time and better wound healing than with open surgery. The patient has been counseled about these surgical options and the risks of surgery in general including infection, bleeding, damage to surrounding structures (including bowel, bladder, ureters, nerves or vessels), and the postoperative risks of PE/ DVT, and lymphedema. I extensively reviewed the additional risks of robotic hysterectomy including possible need for conversion to open laparotomy.  I discussed positioning during surgery of trendelenberg and risks of minor facial swelling and care we take in preoperative positioning.  After counseling and consideration of her options, she desires to proceed with robotic assisted total hysterectomy with bilateral sapingo-oophorectomy and SLN biopsy.   She may require a minilaparotomy for specimen delivery. We discussed this.  She will be seen by anesthesia for preoperative clearance and discussion of postoperative pain management.  She was given the opportunity to ask questions,  which were answered to her satisfaction, and she is agreement with the above mentioned plan of care.   HPI: Sandra Hunt is a 64 year old P4 who was seen in consultation at the request of Dr Nelda Marseille for evaluation of endometrial adenocarcinoma (FIGO grade 2).    The patient reported having menstrual-like cyclical bleeding for approximately 2 years beginning in 2018.  In April 2020 she then began passing heavy clots for approximately 3 months.  With subsequent lighter vaginal persistent daily bleeding.  She was eventually seen by Dr. Nelda Marseille who performed a transvaginal ultrasound scan on July 30, 2019 which revealed a uterus measuring 11.6 x 7.2 x 6.7 cm.  The endometrium was 6 mm in thickness.  The right and left ovaries were grossly normal.  Due to the endometrial thickening she underwent an office transvaginal biopsy on August 06, 2019.  This revealed FIGO grade 2 endometrioid adenocarcinoma.  The patient's obstetric history significant for 4 prior vaginal births.  She has had a tubal ligation and a diagnostic laparoscopy for what was thought to be a possible ectopic but was not.  Additionally her surgical history is remarkable for an open appendectomy.  She is morbidly obese with a BMI of 42 kg meters squared.  She has hypertension but no diabetes mellitus.  She is an ex-smoker but quit in 1983.  She has a family history significant for father with leukemia and a mother with kidney cancer.  She is married and lives with her husband who is of good health.  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  She helps with her brother and her mother with activities of daily living.  Current Meds:  Outpatient Encounter Medications as of 08/17/2019  Medication Sig  .  acetaminophen (TYLENOL) 325 MG tablet Take 650 mg by mouth every 6 (six) hours as needed.  Marland Kitchen ibuprofen (ADVIL) 100 MG tablet Take 600 mg by mouth every 6 (six) hours as needed for fever.  Marland Kitchen lisinopril (ZESTRIL) 10  MG tablet Take 10 mg by mouth daily.  Marland Kitchen PROAIR RESPICLICK [id number] (90 Base) MCG/ACT AEPB Inhale 1 puff into the lungs every 4 (four) hours as needed.  . traMADol (ULTRAM) 50 MG tablet Take 50 mg by mouth daily as needed.   No facility-administered encounter medications on file as of 08/17/2019.     Allergy:  Allergies  Allergen Reactions  . Other     Opioids-nausea  . Sulfacetamide Sodium-Sulfur Rash    Social Hx:   Social History   Socioeconomic History  . Marital status: Single    Spouse name: Not on file  . Number of children: Not on file  . Years of education: Not on file  . Highest education level: Not on file  Occupational History  . Not on file  Tobacco Use  . Smoking status: Former Smoker    Quit date: 1983    Years since quitting: 38.0  . Smokeless tobacco: Never Used  Substance and Sexual Activity  . Alcohol use: Not Currently  . Drug use: Never  . Sexual activity: Yes    Birth control/protection: Other-see comments    Comment: tubal ligation  Other Topics Concern  . Not on file  Social History Narrative  . Not on file   Social Determinants of Health   Financial Resource Strain:   . Difficulty of Paying Living Expenses: Not on file  Food Insecurity:   . Worried About Charity fundraiser in the Last Year: Not on file  . Ran Out of Food in the Last Year: Not on file  Transportation Needs:   . Lack of Transportation (Medical): Not on file  . Lack of Transportation (Non-Medical): Not on file  Physical Activity:   . Days of Exercise per Week: Not on file  . Minutes of Exercise per Session: Not on file  Stress:   . Feeling of Stress : Not on file  Social Connections:   . Frequency of Communication with Friends and Family: Not on file  . Frequency of Social Gatherings with Friends and Family: Not on file  . Attends Religious Services: Not on file  . Active Member of Clubs or Organizations: Not on file  . Attends Archivist Meetings: Not on file   . Marital Status: Not on file  Intimate Partner Violence:   . Fear of Current or Ex-Partner: Not on file  . Emotionally Abused: Not on file  . Physically Abused: Not on file  . Sexually Abused: Not on file    Past Surgical Hx:  Past Surgical History:  Procedure Laterality Date  . APPENDECTOMY    . EYE SURGERY     laser surgery for glaucoma-closed angle glaucoma  . laporoscopy N/A   . TONSILLECTOMY Bilateral   . TUBAL LIGATION      Past Medical Hx:  Past Medical History:  Diagnosis Date  . Allergy   . Asthma    allergic  . Cancer [facility])    endometrial cancer   . Depression   . Glaucoma   . Headache    migraines-but none in many years  . Hearing loss   . Hypertension   . PONV (postoperative nausea and vomiting)   . Pre-diabetes     Past Gynecological  History:  See HPI No LMP recorded. Patient is postmenopausal.  Family Hx:  Family History  Problem Relation Age of Onset  . Leukemia Father   . Diabetes Father   . Diabetes Maternal Grandmother     Review of Systems:  Constitutional  Feels well,    ENT Normal appearing ears and nares bilaterally Skin/Breast  No rash, sores, jaundice, itching, dryness Cardiovascular  No chest pain, shortness of breath, or edema  Pulmonary  No cough or wheeze.  Gastro Intestinal  No nausea, vomitting, or diarrhoea. No bright red blood per rectum, no abdominal pain, change in bowel movement, or constipation.  Genito Urinary  No frequency, urgency, dysuria, + postmenopausal bleeding and uterine cramps Musculo Skeletal  No myalgia, arthralgia, joint swelling or pain  Neurologic  No weakness, numbness, change in gait,  Psychology  No depression, anxiety, insomnia.   Vitals:  There were no vitals taken for this visit.  Physical Exam: WD in NAD Neck  Supple NROM, without any enlargements.  Lymph Node Survey No cervical supraclavicular or inguinal adenopathy Cardiovascular  Pulse normal rate, regularity and rhythm. S1  and S2 normal.  Lungs  Clear to auscultation bilateraly, without wheezes/crackles/rhonchi. Good air movement.  Skin  No rash/lesions/breakdown  Psychiatry  Alert and oriented to person, place, and time  Abdomen  Normoactive bowel sounds, abdomen soft, non-tender and obese without evidence of hernia.  Back No CVA tenderness Genito Urinary  Vulva/vagina: Normal external female genitalia.   No lesions. No discharge or bleeding.  Bladder/urethra:  No lesions or masses, well supported bladder  Vagina: grossly normal  Cervix: flush with cervix, lower uterine segment feels barrelled.   Uterus: Bulky, mobile, bulky widened lower uterine segment,no parametrial involvement or nodularity.  Adnexa: no palpable masses. Rectal  deferred Extremities  No bilateral cyanosis, clubbing or edema.   Thereasa Solo, MD  09/28/2019, 7:00 AM

## 2019-09-28 NOTE — Transfer of Care (Signed)
Immediate Anesthesia Transfer of Care Note  Patient: Sandra Hunt  Procedure(s) Performed: XI ROBOTIC ASSISTED TOTAL HYSTERECTOMY FOR UTERUS > THEN 250G WITH BILATERAL SALPINGO OOPHORECTOMY (N/A ) SENTINEL LYMPH NODE BIOPSY (N/A )  Patient Location: PACU  Anesthesia Type:General  Level of Consciousness: awake, alert  and oriented  Airway & Oxygen Therapy: Patient Spontanous Breathing and Patient connected to face mask oxygen  Post-op Assessment: Report given to RN and Post -op Vital signs reviewed and stable  Post vital signs: Reviewed and stable  Last Vitals:  Vitals Value Taken Time  BP 158/106 09/28/19 1140  Temp    Pulse 87 09/28/19 1143  Resp 18 09/28/19 1143  SpO2 100 % 09/28/19 1143  Vitals shown include unvalidated device data.  Last Pain:  Vitals:   09/28/19 0718  TempSrc:   PainSc: 8          Complications: No apparent anesthesia complications

## 2019-09-28 NOTE — Anesthesia Postprocedure Evaluation (Signed)
Anesthesia Post Note  Patient: Sandra Hunt  Procedure(s) Performed: XI ROBOTIC ASSISTED TOTAL HYSTERECTOMY FOR UTERUS > THEN 250G WITH BILATERAL SALPINGO OOPHORECTOMY (N/A ) SENTINEL LYMPH NODE BIOPSY (N/A )     Patient location during evaluation: PACU Anesthesia Type: General Level of consciousness: awake and alert and oriented Pain management: pain level controlled Vital Signs Assessment: post-procedure vital signs reviewed and stable Respiratory status: spontaneous breathing, nonlabored ventilation and respiratory function stable Cardiovascular status: blood pressure returned to baseline and stable Postop Assessment: no apparent nausea or vomiting Anesthetic complications: no    Last Vitals:  Vitals:   09/28/19 1215 09/28/19 1235  BP: (!) 152/87 (!) 151/84  Pulse: 64 66  Resp: 15 16  Temp: 36.8 C 37.2 C  SpO2: 93% 99%    Last Pain:  Vitals:   09/28/19 1215  TempSrc:   PainSc: 1                  Jezabelle Chisolm A.

## 2019-09-28 NOTE — Op Note (Addendum)
OPERATIVE NOTE 09/27/18  Surgeon: Donaciano Eva   Assistants: Joylene John, NP (a provider assistant was necessary for tissue manipulation, management of robotic instrumentation, retraction and positioning due to the complexity of the case and hospital policies).   Anesthesia: General endotracheal anesthesia  ASA Class: 3   Pre-operative Diagnosis: endometrial cancer grade 2, morbid obesity   Post-operative Diagnosis: same  Operation: Robotic-assisted laparoscopic total hysterectomy >250gm with bilateral salpingoophorectomy, SLN biopsy   Surgeon: Donaciano Eva  Assistant Surgeon: Joylene John, NP  Anesthesia: GET  Urine Output: 100cc  Operative Findings:  12cm bulky uterus without gross extrauterine disease. Bulky lower uterine segment and endocervix, but no gross cervical disease, no suspicious nodes. Uterus weighed in OR - 381gm.  Estimated Blood Loss:  75cc      Total IV Fluids: 800 ml         Specimens: uterus, cervix, bilateral tubes and ovaries, right obturator SLN, right deep obturator SLN         Complications:  None; patient tolerated the procedure well.         Disposition: PACU - hemodynamically stable.  Procedure Details  The patient was seen in the Holding Room. The risks, benefits, complications, treatment options, and expected outcomes were discussed with the patient.  The patient concurred with the proposed plan, giving informed consent.  The site of surgery properly noted/marked. The patient was identified as Akeya Breiner and the procedure verified as a Robotic-assisted hysterectomy with bilateral salpingo oophorectomy with SLN biopsy. A Time Out was held and the above information confirmed.  After induction of anesthesia, the patient was draped and prepped in the usual sterile manner. Pt was placed in supine position after anesthesia and draped and prepped in the usual sterile manner. The abdominal drape was placed after the CholoraPrep had  been allowed to dry for 3 minutes.  Her arms were tucked to her side with all appropriate precautions.  The shoulders were stabilized with padded shoulder blocks applied to the acromium processes.  The patient was placed in the semi-lithotomy position in [place].  The perineum was prepped with Betadine. The patient was then prepped. Foley catheter was placed.  A sterile speculum was placed in the vagina.  The cervix was grasped with a single-tooth tenaculum. 2mg  total of ICG was injected into the cervical stroma at 2 and 9 o'clock with 1cc injected at a 1cm and 71mm depth (concentration 0.5mg /ml) in all locations. The cervix was dilated with Kennon Rounds dilators.  The ZUMI uterine manipulator with a medium colpotomizer ring was placed without difficulty.  A pneum occluder balloon was placed over the manipulator.  OG tube placement was confirmed and to suction. The pannus was taped down prior to sterile draping.  Next, a 5 mm skin incision was made 1 cm below the subcostal margin in the midclavicular line.  The 5 mm Optiview port and scope was used for direct entry.  Opening pressure was under 10 mm CO2.  The abdomen was insufflated and the findings were noted as above.   At this point and all points during the procedure, the patient's intra-abdominal pressure did not exceed 15 mmHg. Next, a 10 mm skin incision was made in the umbilicus and a right and left port was placed about 10 cm lateral to the robot port on the right and left side.  A fourth arm was placed in the left lower quadrant 2 cm above and superior and medial to the anterior superior iliac spine.  All  ports were placed under direct visualization.  The patient was placed in steep Trendelenburg.  Bowel was folded away into the upper abdomen.  The robot was docked in the normal manner.  The right and left peritoneum were opened parallel to the IP ligament to open the retroperitoneal spaces bilaterally. The SLN mapping was performed in bilateral pelvic  basins. The para rectal and paravesical spaces were opened up entirely with careful dissection below the level of the ureters bilaterally and to the depth of the uterine artery origin in order to skeletonize the uterine "web" and ensure visualization of all parametrial channels. The para-aortic basins were carefully exposed and evaluated for isolated para-aortic SLN's. Lymphatic channels were identified travelling to the following visualized sentinel lymph node's: right deep obturator SLN ( deep to nerve) and left obturator SLN. These SLN's were separated from their surrounding lymphatic tissue, removed and sent for permanent pathology.  The hysterectomy was started after the round ligament on the right side was incised and the retroperitoneum was entered and the pararectal space was developed.  The ureter was noted to be on the medial leaf of the broad ligament.  The peritoneum above the ureter was incised and stretched and the infundibulopelvic ligament was skeletonized, cauterized and cut.  The posterior peritoneum was taken down to the level of the KOH ring.  The anterior peritoneum was also taken down.  The bladder flap was created to the level of the KOH ring.  The uterine artery on the right side was skeletonized, cauterized and cut in the normal manner.  A similar procedure was performed on the left.  The colpotomy was made and the uterus, cervix, bilateral ovaries and tubes were amputated but could not be delivered through the vagina due to size therefore it was placed in an endocatch bag.  Pedicles were inspected and excellent hemostasis was achieved.    The colpotomy at the vaginal cuff was closed with Vicryl on a CT1 needle in a manner.  Irrigation was used and excellent hemostasis was achieved.  At this point in the procedure was completed.  Robotic instruments were removed under direct visulaization.  The robot was undocked.   A 6cm epigastric midline incision was made with the scalpel. The  subcutaneous skin was opened with the bovie. The fascia was opened with the bovie vertically and the rectus muscles were dissected off of the fascia inferiorally and superiorally. The peritoneum was opened sharply in the midline. The peritoneal incision was extended. The uterine specimen in the endocatch bag was retrieved through the incision. The fascia was closed with running looped PDS. The subcutaneous fat was closed with 2-0 vicryl. 20cc of exparel mixed with 20cc of marcaine was infiltrated into the incision. The incision was closed at the skin with monocryl and dermabond.   The 10 mm ports were closed with Vicryl on a UR-5 needle and the fascia was closed with 0 Vicryl on a UR-5 needle.  The skin was closed with 4-0 Vicryl in a subcuticular manner.  Dermabond was applied.  Sponge, lap and needle counts correct x 2.  The patient was taken to the recovery room in stable condition.  The vagina was swabbed with  minimal bleeding noted.   All instrument and needle counts were correct x  3.   The patient was transferred to the recovery room in a stable condition.  Donaciano Eva, MD

## 2019-09-28 NOTE — Anesthesia Procedure Notes (Signed)
Procedure Name: Intubation Date/Time: 09/28/2019 8:55 AM Performed by: Maxwell Caul, CRNA Pre-anesthesia Checklist: Patient identified, Emergency Drugs available, Suction available and Patient being monitored Patient Re-evaluated:Patient Re-evaluated prior to induction Oxygen Delivery Method: Circle system utilized Preoxygenation: Pre-oxygenation with 100% oxygen Induction Type: IV induction Ventilation: Mask ventilation without difficulty Laryngoscope Size: Mac and 4 Grade View: Grade I Tube type: Oral Tube size: 7.5 mm Number of attempts: 1 Airway Equipment and Method: Stylet Placement Confirmation: ETT inserted through vocal cords under direct vision,  positive ETCO2 and breath sounds checked- equal and bilateral Secured at: 21 cm Tube secured with: Tape Dental Injury: Teeth and Oropharynx as per pre-operative assessment

## 2019-09-28 NOTE — Discharge Instructions (Addendum)
09/28/2019  Return to work: 4-6 weeks if applicable  Activity: 1. Be up and out of the bed during the day.  Take a nap if needed.  You may walk up steps but be careful and use the hand rail.  Stair climbing will tire you more than you think, you may need to stop part way and rest.   2. No lifting or straining for 6 weeks.  3. No driving for 1-2 week(s).  Do not drive if you are taking narcotic pain medicine.  4. Shower daily.  Use soap and water on your incision and pat dry; don't rub.  No tub baths until cleared by your surgeon.   5. No sexual activity and nothing in the vagina for 8 weeks.  6. You may experience a small amount of clear drainage from your incisions, which is normal.  If the drainage persists or increases, please call the office.  7. You may experience vaginal spotting after surgery or around the 6-8 week mark from surgery when the stitches at the top of the vagina begin to dissolve.  The spotting is normal but if you experience heavy bleeding, call our office.  8. Take Tylenol first for pain and only use narcotic pain medication for severe pain not relieved by the Tylenol.  Monitor your Tylenol intake to a max of 4,000 mg. RECOMMEND AVOIDING NSAIDS IF POSSIBLE GIVEN SLIGHTLY ELEVATED KIDNEY FUNCTION.  9. YOU CAN REMOVE THE WHITE HONEYCOMB DRESSING FROM THE MIDLINE INCISION FIVE DAYS AFTER SURGERY. YOU HAVE DERMABOND GLUE UNDERNEATH SO YOU DO NOT NEED TO REAPPLY A NEW DRESSING.  KEEP THE INCISION CLEAN AND DRY. DO NOT USE ANY SPECIAL CREAMS OR OINTMENTS ON YOUR INCISIONS SINCE THIS WILL DISSOLVE THE DERMABOND.  Diet: 1. Low sodium Heart Healthy Diet is recommended.  2. It is safe to use a laxative, such as Miralax or Colace, if you have difficulty moving your bowels. You can take Sennakot at bedtime every evening to keep bowel movements regular and to prevent constipation.    Wound Care: 1. Keep clean and dry.  Shower daily.  Reasons to call the Doctor:  Fever - Oral  temperature greater than 100.4 degrees Fahrenheit  Foul-smelling vaginal discharge  Difficulty urinating  Nausea and vomiting  Increased pain at the site of the incision that is unrelieved with pain medicine.  Difficulty breathing with or without chest pain  New calf pain especially if only on one side  Sudden, continuing increased vaginal bleeding with or without clots.   Contacts: For questions or concerns you should contact:  Dr. Everitt Amber at [phone]  Joylene John, NP at [phone]  After Hours: call [phone] and have the GYN Oncologist paged/contacted  Oxycodone tablets or capsules What is this medicine? OXYCODONE (ox i KOE done) is a pain reliever. It is used to treat moderate to severe pain. This medicine may be used for other purposes; ask your health care provider or pharmacist if you have questions. COMMON BRAND NAME(S): Dazidox, Endocodone, Oxaydo, OXECTA, OxyIR, Percolone, Roxicodone, Roxybond What should I tell my health care provider before I take this medicine? They need to know if you have any of these conditions:  Addison's disease  brain tumor  head injury  heart disease  history of drug or alcohol abuse problem  if you often drink alcohol  kidney disease  liver disease  lung or breathing disease, like asthma  mental illness  pancreatic disease  seizures  thyroid disease  an unusual or allergic reaction  to oxycodone, codeine, hydrocodone, morphine, other medicines, foods, dyes, or preservatives  pregnant or trying to get pregnant  breast-feeding How should I use this medicine? Take this medicine by mouth with a glass of water. Follow the directions on the prescription label. You can take it with or without food. If it upsets your stomach, take it with food. Take your medicine at regular intervals. Do not take it more often than directed. Do not stop taking except on your doctor's advice. Some brands of this medicine, like  Oxecta, have special instructions. Ask your doctor or pharmacist if these directions are for you: Do not cut, crush or chew this medicine. Swallow only one tablet at a time. Do not wet, soak, or lick the tablet before you take it. A special MedGuide will be given to you by the pharmacist with each prescription and refill. Be sure to read this information carefully each time. Talk to your pediatrician regarding the use of this medicine in children. Special care may be needed. Overdosage: If you think you have taken too much of this medicine contact a poison control center or emergency room at once. NOTE: This medicine is only for you. Do not share this medicine with others. What if I miss a dose? If you miss a dose, take it as soon as you can. If it is almost time for your next dose, take only that dose. Do not take double or extra doses. What may interact with this medicine? This medicine may interact with the following medications:  alcohol  antihistamines for allergy, cough and cold  antiviral medicines for HIV or AIDS  atropine  certain antibiotics like clarithromycin, erythromycin, linezolid, rifampin  certain medicines for anxiety or sleep  certain medicines for bladder problems like oxybutynin, tolterodine  certain medicines for depression like amitriptyline, fluoxetine, sertraline  certain medicines for fungal infections like ketoconazole, itraconazole, voriconazole  certain medicines for migraine headache like almotriptan, eletriptan, frovatriptan, naratriptan, rizatriptan, sumatriptan, zolmitriptan  certain medicines for nausea or vomiting like dolasetron, ondansetron, palonosetron  certain medicines for Parkinson's disease like benztropine, trihexyphenidyl  certain medicines for seizures like phenobarbital, phenytoin, primidone  certain medicines for stomach problems like dicyclomine, hyoscyamine  certain medicines for travel sickness like  scopolamine  diuretics  general anesthetics like halothane, isoflurane, methoxyflurane, propofol  ipratropium  local anesthetics like lidocaine, pramoxine, tetracaine  MAOIs like Carbex, Eldepryl, Marplan, Nardil, and Parnate  medicines that relax muscles for surgery  methylene blue  nilotinib  other narcotic medicines for pain or cough  phenothiazines like chlorpromazine, mesoridazine, prochlorperazine, thioridazine This list may not describe all possible interactions. Give your health care provider a list of all the medicines, herbs, non-prescription drugs, or dietary supplements you use. Also tell them if you smoke, drink alcohol, or use illegal drugs. Some items may interact with your medicine. What should I watch for while using this medicine? Tell your health care provider if your pain does not go away, if it gets worse, or if you have new or a different type of pain. You may develop tolerance to this drug. Tolerance means that you will need a higher dose of the drug for pain relief. Tolerance is normal and is expected if you take this drug for a long time. There are different types of narcotic drugs (opioids) for pain. If you take more than one type at the same time, you may have more side effects. Give your health care provider a list of all drugs you use. He or she  will tell you how much drug to take. Do not take more drug than directed. Get emergency help right away if you have problems breathing. Do not suddenly stop taking your drug because you may develop a severe reaction. Your body becomes used to the drug. This does NOT mean you are addicted. Addiction is a behavior related to getting and using a drug for a nonmedical reason. If you have pain, you have a medical reason to take pain drug. Your health care provider will tell you how much drug to take. If your health care provider wants you to stop the drug, the dose will be slowly lowered over time to avoid any side  effects. Talk to your health care provider about naloxone and how to get it. Naloxone is an emergency drug used for an opioid overdose. An overdose can happen if you take too much opioid. It can also happen if an opioid is taken with some other drugs or substances, like alcohol. Know the symptoms of an overdose, like trouble breathing, unusually tired or sleepy, or not being able to respond or wake up. Make sure to tell caregivers and close contacts where it is stored. Make sure they know how to use it. After naloxone is given, you must get emergency help right away. Naloxone is a temporary treatment. Repeat doses may be needed. You may get drowsy or dizzy. Do not drive, use machinery, or do anything that needs mental alertness until you know how this drug affects you. Do not stand up or sit up quickly, especially if you are an older patient. This reduces the risk of dizzy or fainting spells. Alcohol may interfere with the effect of this drug. Avoid alcoholic drinks. This drug will cause constipation. If you do not have a bowel movement for 3 days, call your health care provider. Your mouth may get dry. Chewing sugarless gum or sucking hard candy and drinking plenty of water may help. Contact your health care provider if the problem does not go away or is severe. The tablet shell for some brands of this drug does not dissolve. This is normal. The tablet shell may appear whole in the stool. This is not a cause for concern. What side effects may I notice from receiving this medicine? Side effects that you should report to your doctor or health care professional as soon as possible:  allergic reactions like skin rash, itching or hives, swelling of the face, lips, or tongue  breathing problems  confusion  signs and symptoms of low blood pressure like dizziness; feeling faint or lightheaded, falls; unusually weak or tired  trouble passing urine or change in the amount of urine  trouble swallowing Side  effects that usually do not require medical attention (report to your doctor or health care professional if they continue or are bothersome):  constipation  dry mouth  nausea, vomiting  tiredness This list may not describe all possible side effects. Call your doctor for medical advice about side effects. You may report side effects to FDA at 1-800-FDA-1088. Where should I keep my medicine? Keep out of the reach of children. This medicine can be abused. Keep your medicine in a safe place to protect it from theft. Do not share this medicine with anyone. Selling or giving away this medicine is dangerous and against the law. Store at room temperature between 15 and 30 degrees C (59 and 86 degrees F). Protect from light. Keep container tightly closed. This medicine may cause harm and death if  it is taken by other adults, children, or pets. Return medicine that has not been used to an official disposal site. Contact the DEA at 440-872-0637 or your city/county government to find a site. If you cannot return the medicine, flush it down the toilet. Do not use the medicine after the expiration date. NOTE: This sheet is a summary. It may not cover all possible information. If you have questions about this medicine, talk to your doctor, pharmacist, or health care provider.  2020 Elsevier/Gold Standard (2019-04-14 12:47:59)

## 2019-09-29 ENCOUNTER — Telehealth: Payer: Self-pay

## 2019-09-29 NOTE — Telephone Encounter (Signed)
Sandra Hunt states that she is doing well. She is using the Tylenol for pain q 6 hrs.  Using Oxycodone prn. Has only needed 1 tablet last evening thus far. Afebrile. She is eating, drinking, and urinating well. She has not passed gas or had BM.  She did begin Senokot-S last night.  Told her that if no BM or passing gas today that she can increase the Senokot-S to 2 tabs bid tomorrow. Her throat is sore. Suggested gargling with warm salt water.  Dressing and incisions are D&I. Some bruising  around incisions. Pt aware of phone visit and post op appointment as well as the office number [phone] to call if she has any questions or concerns.

## 2019-10-06 LAB — SURGICAL PATHOLOGY

## 2019-10-08 ENCOUNTER — Inpatient Hospital Stay: Payer: Federal, State, Local not specified - PPO | Attending: Gynecologic Oncology | Admitting: Gynecologic Oncology

## 2019-10-08 DIAGNOSIS — C541 Malignant neoplasm of endometrium: Secondary | ICD-10-CM

## 2019-10-08 NOTE — Progress Notes (Signed)
Gynecologic Oncology Telehealth Consult Note: Gyn-Onc  I connected with Sandra Hunt on 10/08/19 at  3:30 PM EST by telephone and verified that I am speaking with the correct person using two identifiers.  I discussed the limitations, risks, security and privacy concerns of performing an evaluation and management service by telemedicine and the availability of in-person appointments. I also discussed with the patient that there may be a patient responsible charge related to this service. The patient expressed understanding and agreed to proceed.  Other persons participating in the visit and their role in the encounter: none.  Patient's location: home Provider's location: [place]  Chief Complaint:  Chief Complaint  Patient presents with  . endometrial cancer    counseling and coordination of care    Assessment/Plan:  Sandra Hunt  is a 64 y.o.  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 MSI pending.  Pathology revealed low risk factors for recurrence, therefore no adjuvant therapy is recommended according to NCCN guidelines.  I discussed risk for recurrence and typical symptoms encouraged her to notify us of these should they develop between visits.  I recommend she have follow-up every 6 months for 5 years in accordance with NCCN guidelines. Those visits should include symptom assessment, physical exam and pelvic examination. Pap smears are not indicated or recommended in the routine surveillance of endometrial cancer.  HPI: Sandra Hunt is a 64 year old P4 who was seen in consultation at the request of Dr Nelda Marseille for evaluation of endometrial adenocarcinoma (FIGO grade 2).    The patient reported having menstrual-like cyclical bleeding for approximately 2 years beginning in 2018.  In April 2020 she then began passing heavy clots for approximately 3 months.  With subsequent lighter vaginal persistent daily  bleeding.  She was eventually seen by Dr. Nelda Marseille who performed a transvaginal ultrasound scan on July 30, 2019 which revealed a uterus measuring 11.6 x 7.2 x 6.7 cm.  The endometrium was 6 mm in thickness.  The right and left ovaries were grossly normal.  Due to the endometrial thickening she underwent an office transvaginal biopsy on August 06, 2019.  This revealed FIGO grade 2 endometrioid adenocarcinoma.  The patient's obstetric history significant for 4 prior vaginal births.  She has had a tubal ligation and a diagnostic laparoscopy for what was thought to be a possible ectopic but was not.  Additionally her surgical history is remarkable for an open appendectomy.  She is morbidly obese with a BMI of 42 kg meters squared.  She has hypertension but no diabetes mellitus.  She is an ex-smoker but quit in 1983.  She has a family history significant for father with leukemia and a mother with kidney cancer.  She is married and lives with her husband who is of good health.  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  She helps with her brother and her mother with activities of daily living.  Interval Hx: On 09/28/19 she underwent robotic assisted total hysterectomy with BSO and SLN biopsy for a uterus >250gm with minilaparotomy for specimen delivery.  Intraoperative findings were significant for a 12cm bulky uterus without gross extrauterine disease, bulky lower uterine segment and endocervix with no gross cervical disease, no suspicious nodes.  Final pathology revealed a FIGO grade 1 endometrioid endometrial cancer with inner half (0.4 of 2.5cm) myometrial invasion. LVSI (focal) was present. There was no cervical or nodal involvement.  MSI pending.   She was determined to have stage IA grade 1 cancer with low risk disease (only focal LVSI) and in accordance with NCCN guidelines, no adjuvant therapy was recommended.  She is doing well postop with no complaints.   Current Meds:   Outpatient Encounter Medications as of 10/08/2019  Medication Sig  . acetaminophen (TYLENOL) 500 MG tablet Take 1,000 mg by mouth every 6 (six) hours as needed (for pain.).  Marland Kitchen Coconut Oil 1000 MG CAPS Take 2,000 mg by mouth 2 (two) times daily. For Bursitis  . lisinopril (ZESTRIL) 20 MG tablet Take 20 mg by mouth daily.  . Multiple Vitamin (MULTIVITAMIN WITH MINERALS) TABS tablet Take 1 tablet by mouth daily.  Marland Kitchen oxyCODONE (OXY IR/ROXICODONE) 5 MG immediate release tablet Take 1 tablet (5 mg total) by mouth every 4 (four) hours as needed for severe pain. Do not take and drive, do not take with tramadol  . PROAIR RESPICLICK 983 (90 Base) MCG/ACT AEPB Inhale 1 puff into the lungs every 4 (four) hours as needed (wheezing/shortness of breath).   . senna-docusate (SENOKOT-S) 8.6-50 MG tablet Take 2 tablets by mouth at bedtime. Do not take if having diarrhea  . traMADol (ULTRAM) 50 MG tablet Take 1 tablet (50 mg total) by mouth daily as needed for severe pain. Do not take and drive   No facility-administered encounter medications on file as of 10/08/2019.    Allergy:  Allergies  Allergen Reactions  . Other     Opioids-nausea  . Sulfacetamide Sodium-Sulfur Rash    Social Hx:   Social History   Socioeconomic History  . Marital status: Single    Spouse name: Not on file  . Number of children: Not on file  . Years of education: Not on file  . Highest education level: Not on file  Occupational History  . Not on file  Tobacco Use  . Smoking status: Former Smoker    Quit date: 1983    Years since quitting: 38.0  . Smokeless tobacco: Never Used  Substance and Sexual Activity  . Alcohol use: Not Currently  . Drug use: Never  . Sexual activity: Yes    Birth control/protection: Other-see comments    Comment: tubal ligation  Other Topics Concern  . Not on file  Social History Narrative  . Not on file   Social Determinants of Health   Financial Resource Strain:   . Difficulty of  Paying Living Expenses: Not on file  Food Insecurity:   . Worried About Charity fundraiser in the Last Year: Not on file  . Ran Out of Food in the Last Year: Not on file  Transportation Needs:   . Lack of Transportation (Medical): Not on file  . Lack of Transportation (Non-Medical): Not on file  Physical Activity:   . Days of Exercise per Week: Not on file  . Minutes of Exercise per Session: Not on file  Stress:   . Feeling of Stress : Not on file  Social Connections:   . Frequency of Communication with Friends and Family: Not on file  . Frequency of Social Gatherings with Friends and Family: Not on file  . Attends Religious Services: Not on file  . Active Member of Clubs or Organizations: Not on file  . Attends Archivist Meetings: Not on file  . Marital Status: Not on file  Intimate Partner Violence:   . Fear of Current or Ex-Partner: Not on file  . Emotionally Abused: Not on  file  . Physically Abused: Not on file  . Sexually Abused: Not on file    Past Surgical Hx:  Past Surgical History:  Procedure Laterality Date  . APPENDECTOMY    . EYE SURGERY     laser surgery for glaucoma-closed angle glaucoma  . laporoscopy N/A   . ROBOTIC ASSISTED TOTAL HYSTERECTOMY WITH BILATERAL SALPINGO OOPHERECTOMY N/A 09/28/2019   Procedure: XI ROBOTIC ASSISTED TOTAL HYSTERECTOMY FOR UTERUS > THEN 250G WITH BILATERAL SALPINGO OOPHORECTOMY;  Surgeon: Everitt Amber, MD;  Location: [facility];  Service: Gynecology;  Laterality: N/A;  . SENTINEL NODE BIOPSY N/A 09/28/2019   Procedure: SENTINEL LYMPH NODE BIOPSY;  Surgeon: Everitt Amber, MD;  Location: [facility];  Service: Gynecology;  Laterality: N/A;  . TONSILLECTOMY Bilateral   . TUBAL LIGATION      Past Medical Hx:  Past Medical History:  Diagnosis Date  . Allergy   . Asthma    allergic  . Cancer [facility])    endometrial cancer   . Depression   . Glaucoma   . Headache    migraines-but none in many years  . Hearing loss   . Hypertension    . PONV (postoperative nausea and vomiting)   . Pre-diabetes     Past Gynecological History:  See HPI No LMP recorded. Patient is postmenopausal.  Family Hx:  Family History  Problem Relation Age of Onset  . Leukemia Father   . Diabetes Father   . Diabetes Maternal Grandmother     Review of Systems:  Constitutional  Feels well,    ENT Normal appearing ears and nares bilaterally Skin/Breast  No rash, sores, jaundice, itching, dryness Cardiovascular  No chest pain, shortness of breath, or edema  Pulmonary  No cough or wheeze.  Gastro Intestinal  No nausea, vomitting, or diarrhoea. No bright red blood per rectum, no abdominal pain, change in bowel movement, or constipation.  Genito Urinary  No frequency, urgency, dysuria, + postmenopausal bleeding and uterine cramps Musculo Skeletal  No myalgia, arthralgia, joint swelling or pain  Neurologic  No weakness, numbness, change in gait,  Psychology  No depression, anxiety, insomnia.   Vitals:  There were no vitals taken for this visit.  Physical exam:  Deferred  I discussed the assessment and treatment plan with the patient. The patient was provided with an opportunity to ask questions and all were answered. The patient agreed with the plan and demonstrated an understanding of the instructions.   The patient was advised to call back or see an in-person evaluation if the symptoms worsen or if the condition fails to improve as anticipated.   I provided 10 minutes of non face-to-face telephone visit time during this encounter, and > 50% was spent counseling as documented under my assessment & plan.   Thereasa Solo, MD  10/08/2019, 3:34 PM

## 2019-10-09 ENCOUNTER — Encounter (HOSPITAL_COMMUNITY): Payer: Self-pay | Admitting: Gynecologic Oncology

## 2019-10-20 ENCOUNTER — Encounter: Payer: Self-pay | Admitting: Gynecologic Oncology

## 2019-10-20 ENCOUNTER — Inpatient Hospital Stay: Payer: Federal, State, Local not specified - PPO | Attending: Gynecologic Oncology | Admitting: Gynecologic Oncology

## 2019-10-20 ENCOUNTER — Other Ambulatory Visit: Payer: Self-pay

## 2019-10-20 VITALS — BP 133/86 | HR 115 | Temp 97.8°F | Resp 20 | Ht 69.75 in | Wt 280.0 lb

## 2019-10-20 DIAGNOSIS — Z6841 Body Mass Index (BMI) 40.0 and over, adult: Secondary | ICD-10-CM | POA: Diagnosis not present

## 2019-10-20 DIAGNOSIS — Z8051 Family history of malignant neoplasm of kidney: Secondary | ICD-10-CM | POA: Insufficient documentation

## 2019-10-20 DIAGNOSIS — Z90722 Acquired absence of ovaries, bilateral: Secondary | ICD-10-CM | POA: Insufficient documentation

## 2019-10-20 DIAGNOSIS — Z87891 Personal history of nicotine dependence: Secondary | ICD-10-CM | POA: Insufficient documentation

## 2019-10-20 DIAGNOSIS — Z7189 Other specified counseling: Secondary | ICD-10-CM

## 2019-10-20 DIAGNOSIS — C801 Malignant (primary) neoplasm, unspecified: Secondary | ICD-10-CM

## 2019-10-20 DIAGNOSIS — I10 Essential (primary) hypertension: Secondary | ICD-10-CM | POA: Diagnosis not present

## 2019-10-20 DIAGNOSIS — Z90710 Acquired absence of both cervix and uterus: Secondary | ICD-10-CM | POA: Diagnosis not present

## 2019-10-20 DIAGNOSIS — Z79899 Other long term (current) drug therapy: Secondary | ICD-10-CM | POA: Diagnosis not present

## 2019-10-20 DIAGNOSIS — Z807 Family history of other malignant neoplasms of lymphoid, hematopoietic and related tissues: Secondary | ICD-10-CM | POA: Insufficient documentation

## 2019-10-20 DIAGNOSIS — C541 Malignant neoplasm of endometrium: Secondary | ICD-10-CM | POA: Insufficient documentation

## 2019-10-20 NOTE — Patient Instructions (Signed)
Please notify Dr Denman George at phone number [phone] if you notice vaginal bleeding, new pelvic or abdominal pains, bloating, feeling full easy, or a change in bladder or bowel function.   Please contact Dr Serita Grit office (at [phone]) in April, 2021 to request an appointment with her for August, 2021.   Dr Denman George recommends 1 more week of no heavy lifting and 1 more month of no intercourse.

## 2019-10-20 NOTE — Progress Notes (Signed)
Gynecologic Oncology Follow-up  Chief Complaint:  Chief Complaint  Patient presents with  . Endometrial cancer [facility])    Assessment/Plan:  Sandra Hunt  is a 64 y.o.  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 MSI high (MLH1 and PMS2 loss of nuclear expression).   Pathology revealed low risk factors for recurrence, therefore no adjuvant therapy is recommended according to NCCN guidelines.  I discussed risk for recurrence and typical symptoms encouraged her to notify us of these should they develop between visits.  I recommend she have follow-up every 6 months for 5 years in accordance with NCCN guidelines. Those visits should include symptom assessment, physical exam and pelvic examination. Pap smears are not indicated or recommended in the routine surveillance of endometrial cancer.  She has MSI high endometrial tumor - refer to genetics. Discussed that this may be associated with Lynch syndrome and that we should refer to genetics to evaluate for this. Referral made.   HPI: Sandra Hunt is a 64 year old P4 who was seen in consultation at the request of Dr Nelda Marseille for evaluation of endometrial adenocarcinoma (FIGO grade 2).    The patient reported having menstrual-like cyclical bleeding for approximately 2 years beginning in 2018.  In April 2020 she then began passing heavy clots for approximately 3 months.  With subsequent lighter vaginal persistent daily bleeding.  She was eventually seen by Dr. Nelda Marseille who performed a transvaginal ultrasound scan on July 30, 2019 which revealed a uterus measuring 11.6 x 7.2 x 6.7 cm.  The endometrium was 6 mm in thickness.  The right and left ovaries were grossly normal.  Due to the endometrial thickening she underwent an office transvaginal biopsy on August 06, 2019.  This revealed FIGO grade 2 endometrioid adenocarcinoma.  The patient's obstetric history significant for 4 prior vaginal births.   She has had a tubal ligation and a diagnostic laparoscopy for what was thought to be a possible ectopic but was not.  Additionally her surgical history is remarkable for an open appendectomy.  She is morbidly obese with a BMI of 42 kg meters squared.  She has hypertension but no diabetes mellitus.  She is an ex-smoker but quit in 1983.  She has a family history significant for father with leukemia and a mother with kidney cancer.  She is married and lives with her husband who is of good health.  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  She helps with her brother and her mother with activities of daily living.  Interval Hx: On 09/28/19 she underwent robotic assisted total hysterectomy with BSO and SLN biopsy for a uterus >250gm with minilaparotomy for specimen delivery.  Intraoperative findings were significant for a 12cm bulky uterus without gross extrauterine disease, bulky lower uterine segment and endocervix with no gross cervical disease, no suspicious nodes.  Final pathology revealed a FIGO grade 1 endometrioid endometrial cancer with inner half (0.4 of 2.5cm) myometrial invasion. LVSI (focal) was present. There was no cervical or nodal involvement. MSI high (loss of nuclear expression of MLH1 and PMS2).   She was determined to have stage IA grade 1 cancer with low risk disease (only focal LVSI) and in accordance with NCCN guidelines, no adjuvant therapy was recommended.  She is doing well postop with no complaints.   Current Meds:  Outpatient Encounter Medications as of 10/20/2019  Medication Sig  . Coconut Oil 1000 MG CAPS Take 2,000  mg by mouth 2 (two) times daily. For Bursitis  . lisinopril (ZESTRIL) 20 MG tablet Take 20 mg by mouth daily.  . Multiple Vitamin (MULTIVITAMIN WITH MINERALS) TABS tablet Take 1 tablet by mouth daily.  Marland Kitchen PROAIR RESPICLICK 481 (90 Base) MCG/ACT AEPB Inhale 1 puff into the lungs every 4 (four) hours as needed (wheezing/shortness of breath).    . [DISCONTINUED] acetaminophen (TYLENOL) 500 MG tablet Take 1,000 mg by mouth every 6 (six) hours as needed (for pain.).  . [DISCONTINUED] oxyCODONE (OXY IR/ROXICODONE) 5 MG immediate release tablet Take 1 tablet (5 mg total) by mouth every 4 (four) hours as needed for severe pain. Do not take and drive, do not take with tramadol  . [DISCONTINUED] senna-docusate (SENOKOT-S) 8.6-50 MG tablet Take 2 tablets by mouth at bedtime. Do not take if having diarrhea  . [DISCONTINUED] traMADol (ULTRAM) 50 MG tablet Take 1 tablet (50 mg total) by mouth daily as needed for severe pain. Do not take and drive   No facility-administered encounter medications on file as of 10/20/2019.    Allergy:  Allergies  Allergen Reactions  . Other     Opioids-nausea  . Sulfacetamide Sodium-Sulfur Rash    Social Hx:   Social History   Socioeconomic History  . Marital status: Single    Spouse name: Not on file  . Number of children: Not on file  . Years of education: Not on file  . Highest education level: Not on file  Occupational History  . Not on file  Tobacco Use  . Smoking status: Former Smoker    Quit date: 1983    Years since quitting: 38.1  . Smokeless tobacco: Never Used  Substance and Sexual Activity  . Alcohol use: Not Currently  . Drug use: Never  . Sexual activity: Yes    Birth control/protection: Other-see comments    Comment: tubal ligation  Other Topics Concern  . Not on file  Social History Narrative  . Not on file   Social Determinants of Health   Financial Resource Strain:   . Difficulty of Paying Living Expenses: Not on file  Food Insecurity:   . Worried About Charity fundraiser in the Last Year: Not on file  . Ran Out of Food in the Last Year: Not on file  Transportation Needs:   . Lack of Transportation (Medical): Not on file  . Lack of Transportation (Non-Medical): Not on file  Physical Activity:   . Days of Exercise per Week: Not on file  . Minutes of Exercise per  Session: Not on file  Stress:   . Feeling of Stress : Not on file  Social Connections:   . Frequency of Communication with Friends and Family: Not on file  . Frequency of Social Gatherings with Friends and Family: Not on file  . Attends Religious Services: Not on file  . Active Member of Clubs or Organizations: Not on file  . Attends Archivist Meetings: Not on file  . Marital Status: Not on file  Intimate Partner Violence:   . Fear of Current or Ex-Partner: Not on file  . Emotionally Abused: Not on file  . Physically Abused: Not on file  . Sexually Abused: Not on file    Past Surgical Hx:  Past Surgical History:  Procedure Laterality Date  . APPENDECTOMY    . EYE SURGERY     laser surgery for glaucoma-closed angle glaucoma  . laporoscopy N/A   . ROBOTIC ASSISTED TOTAL HYSTERECTOMY WITH  BILATERAL SALPINGO OOPHERECTOMY N/A 09/28/2019   Procedure: XI ROBOTIC ASSISTED TOTAL HYSTERECTOMY FOR UTERUS > THEN 250G WITH BILATERAL SALPINGO OOPHORECTOMY;  Surgeon: Everitt Amber, MD;  Location: [facility];  Service: Gynecology;  Laterality: N/A;  . SENTINEL NODE BIOPSY N/A 09/28/2019   Procedure: SENTINEL LYMPH NODE BIOPSY;  Surgeon: Everitt Amber, MD;  Location: [facility];  Service: Gynecology;  Laterality: N/A;  . TONSILLECTOMY Bilateral   . TUBAL LIGATION      Past Medical Hx:  Past Medical History:  Diagnosis Date  . Allergy   . Asthma    allergic  . Cancer [facility])    endometrial cancer   . Depression   . Glaucoma   . Headache    migraines-but none in many years  . Hearing loss   . Hypertension   . PONV (postoperative nausea and vomiting)   . Pre-diabetes     Past Gynecological History:  See HPI No LMP recorded. Patient is postmenopausal.  Family Hx:  Family History  Problem Relation Age of Onset  . Leukemia Father   . Diabetes Father   . Diabetes Maternal Grandmother     Review of Systems:  Constitutional  Feels well,    ENT Normal appearing ears and nares  bilaterally Skin/Breast  No rash, sores, jaundice, itching, dryness Cardiovascular  No chest pain, shortness of breath, or edema  Pulmonary  No cough or wheeze.  Gastro Intestinal  No nausea, vomitting, or diarrhoea. No bright red blood per rectum, no abdominal pain, change in bowel movement, or constipation.  Genito Urinary  No frequency, urgency, dysuria, + postmenopausal bleeding and uterine cramps Musculo Skeletal  No myalgia, arthralgia, joint swelling or pain  Neurologic  No weakness, numbness, change in gait,  Psychology  No depression, anxiety, insomnia.   Vitals:  Blood pressure 133/86, pulse (!) 115, temperature 97.8 F (36.6 C), temperature source Temporal, resp. rate 20, height 5' 9.75" (1.772 m), weight 280 lb (127 kg), SpO2 99 %.  Physical Exam: WD in NAD Neck  Supple NROM, without any enlargements.  Lymph Node Survey No cervical supraclavicular or inguinal adenopathy Cardiovascular  Pulse normal rate, regularity and rhythm. S1 and S2 normal.  Lungs  Clear to auscultation bilateraly, without wheezes/crackles/rhonchi. Good air movement.  Skin  No rash/lesions/breakdown  Psychiatry  Alert and oriented to person, place, and time  Abdomen  Normoactive bowel sounds, abdomen soft, non-tender and obese without evidence of hernia. Well healed incisions Back No CVA tenderness Genito Urinary  Vaginal cuff healing normally, scant blood.  Rectal  deferred Extremities  No bilateral cyanosis, clubbing or edema.  30 minutes of direct face to face counseling time was spent with the patient. This included discussion about prognosis, therapy recommendations and postoperative side effects and are beyond the scope of routine postoperative care.   Thereasa Solo, MD  10/20/2019, 4:30 PM

## 2019-10-26 ENCOUNTER — Other Ambulatory Visit: Payer: Self-pay

## 2019-10-26 ENCOUNTER — Inpatient Hospital Stay (HOSPITAL_BASED_OUTPATIENT_CLINIC_OR_DEPARTMENT_OTHER): Payer: Federal, State, Local not specified - PPO | Admitting: Licensed Clinical Social Worker

## 2019-10-26 ENCOUNTER — Inpatient Hospital Stay: Payer: Federal, State, Local not specified - PPO

## 2019-10-26 ENCOUNTER — Encounter: Payer: Self-pay | Admitting: Licensed Clinical Social Worker

## 2019-10-26 DIAGNOSIS — Z806 Family history of leukemia: Secondary | ICD-10-CM

## 2019-10-26 DIAGNOSIS — Z8051 Family history of malignant neoplasm of kidney: Secondary | ICD-10-CM

## 2019-10-26 DIAGNOSIS — C801 Malignant (primary) neoplasm, unspecified: Secondary | ICD-10-CM

## 2019-10-26 NOTE — Progress Notes (Signed)
REFERRING PROVIDER: Everitt Amber, MD [place],  [place] [id number]  PRIMARY PROVIDER:  Donald Prose, MD  PRIMARY REASON FOR VISIT:  1. 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 Family history of leukemia   3. Family history of kidney cancer      HISTORY OF PRESENT ILLNESS:   Ms. Stare, a 64 y.o. female, was seen for a [place] cancer genetics consultation at the request of Dr. Denman George due to a personal and family history of cancer.  Ms. Laskowski presents to clinic today to discuss the possibility of a hereditary predisposition to cancer, genetic testing, and to further clarify her future cancer risks, as well as potential cancer risks for family members.   In 2020, at the age of 72, Ms. Engdahl was diagnosed with endometrioid adenocarcinoma of the uterus. This was treated with TAH BSO on 09/28/2019, no adjuvant therapy. Tumor testing did show loss of MLH1 and PMS2, MLH1 hypermethylation was present indicating this is likely sporadic and not Lynch syndrome.   CANCER HISTORY:  Oncology History   No history exists.     RISK FACTORS:  Menarche was at age 62.  First live birth at age 80.  OCP use for approximately 5 years.  Ovaries intact: no.  Hysterectomy: yes.  Menopausal status: postmenopausal.  HRT use: 0 years. Colonoscopy: no; not examined- she has done Cologuard. Mammogram within the last year: no. Number of breast biopsies: 0. Up to date with pelvic exams: yes. Any excessive radiation exposure in the past: she does report repeated pelvic x-rays as a child due to a hip issue, and she notes that she was not covered with a lead apron for many of these x-rays.   Past Medical History:  Diagnosis Date  . Allergy   . Asthma    allergic  . Cancer [facility])    endometrial cancer   . Depression   . Family history of kidney cancer   . Family history of leukemia   . Glaucoma   . Headache    migraines-but none in  many years  . Hearing loss   . Hypertension   . PONV (postoperative nausea and vomiting)   . Pre-diabetes     Past Surgical History:  Procedure Laterality Date  . APPENDECTOMY    . EYE SURGERY     laser surgery for glaucoma-closed angle glaucoma  . laporoscopy N/A   . ROBOTIC ASSISTED TOTAL HYSTERECTOMY WITH BILATERAL SALPINGO OOPHERECTOMY N/A 09/28/2019   Procedure: XI ROBOTIC ASSISTED TOTAL HYSTERECTOMY FOR UTERUS > THEN 250G WITH BILATERAL SALPINGO OOPHORECTOMY;  Surgeon: Everitt Amber, MD;  Location: [facility];  Service: Gynecology;  Laterality: N/A;  . SENTINEL NODE BIOPSY N/A 09/28/2019   Procedure: SENTINEL LYMPH NODE BIOPSY;  Surgeon: Everitt Amber, MD;  Location: [facility];  Service: Gynecology;  Laterality: N/A;  . TONSILLECTOMY Bilateral   . TUBAL LIGATION      Social History   Socioeconomic History  . Marital status: Single    Spouse name: Not on file  . Number of children: Not on file  . Years of education: Not on file  . Highest education level: Not on file  Occupational History  . Not on file  Tobacco Use  . Smoking status: Former Smoker    Quit date: 1983    Years since quitting: 38.1  . Smokeless tobacco: Never Used  Substance and Sexual Activity  . Alcohol use: Not Currently  . Drug use: Never  .  Sexual activity: Yes    Birth control/protection: Other-see comments    Comment: tubal ligation  Other Topics Concern  . Not on file  Social History Narrative  . Not on file   Social Determinants of Health   Financial Resource Strain:   . Difficulty of Paying Living Expenses: Not on file  Food Insecurity:   . Worried About Charity fundraiser in the Last Year: Not on file  . Ran Out of Food in the Last Year: Not on file  Transportation Needs:   . Lack of Transportation (Medical): Not on file  . Lack of Transportation (Non-Medical): Not on file  Physical Activity:   . Days of Exercise per Week: Not on file  . Minutes of Exercise per Session: Not on file   Stress:   . Feeling of Stress : Not on file  Social Connections:   . Frequency of Communication with Friends and Family: Not on file  . Frequency of Social Gatherings with Friends and Family: Not on file  . Attends Religious Services: Not on file  . Active Member of Clubs or Organizations: Not on file  . Attends Archivist Meetings: Not on file  . Marital Status: Not on file     FAMILY HISTORY:  We obtained a detailed, 4-generation family history.  Significant diagnoses are listed below: Family History  Problem Relation Age of Onset  . Leukemia Father   . Diabetes Father   . Diabetes Maternal Grandmother   . Kidney cancer Maternal Grandmother    Ms. Randol has 4 sons, no cancers. She has 2 sisters and a brother. Her sister is deceased due to respiratory failure. No history of cancer for her siblings. She also recently discovered she has a maternal half sister, no history of cancer for her.   Ms. Rickett's mother is living at 63, possible history of skin cancer but patient unsure. Patient has 2 maternal uncles, 1 maternal aunt. None of these are full siblings for her mother. No known cancers in these individuals, and none in her maternal cousins that she is aware of. Maternal grandmother had kidney cancer at 50 that was just treated with surgery. Maternal grandfather died due to a gunshot wound at 74, no cancer history.  Ms. Rickett's father died at 18 and had a history of leukemia diagnosed at 34. Patient had 1 paternal aunt, 1 paternal uncle, no cancers. She did have 1 paternal cousin who died at age 41 of a brain tumor. Another paternal cousin died in his 42s due to cancer but she is unsure the type. Paternal grandmother died at 69 and grandfather was killed "young" in an accident.   Ms. Lightle is unaware of previous family history of genetic testing for hereditary cancer risks. Patient did [place] testing and her ancestors are of [place], [place], [place] and  Native American descent. There is no reported Ashkenazi Jewish ancestry. There is no known consanguinity.  GENETIC COUNSELING ASSESSMENT: Ms. Helder is a 64 y.o. female with a personal and family history which is not very suggestive of a hereditary cancer syndrome and predisposition to cancer. We, therefore, discussed and recommended the following at today's visit.   DISCUSSION: We discussed that 5 - 7% of endometrial cancer is hereditary, with most cases associated with Lynch syndrome. We explained the tumor testing she had and that it suggests she does not have Lynch syndrome. There are other genes that can be associated with hereditary endometrial cancer syndromes, however her personal/ family  history is not suggestive of these conditions. We discussed that testing is beneficial for several reasons including knowing how to follow individuals after completing their treatment, and understand if other family members could be at risk for cancer and allow them to undergo genetic testing.   We discussed with Ms. Rayner that the personal and family history does not meet insurance or NCCN criteria for genetic testing and, therefore, is not highly consistent with a familial hereditary cancer syndrome.  We feel she is at low risk to harbor a gene mutation associated with such a condition. Thus, we did not recommend any genetic testing, at this time, and recommended Ms. Caison continue to follow the cancer screening guidelines given by her primary healthcare provider.   We did offer for her to undergo genetic testing for a patient pay price should she want to proceed with it despite not meeting criteria. Patient declined at this time, and said she may consider in the future.   PLAN: , Ms. Co did not wish to pursue genetic testing at today's visit. We understand this decision and remain available to coordinate genetic testing at any time in the future. We, therefore, recommend Ms. Simerly continue to  follow the cancer screening guidelines given by her primary healthcare provider.  Lastly, we encouraged Ms. Samples to remain in contact with cancer genetics annually so that we can continuously update the family history and inform her of any changes in cancer genetics and testing that may be of benefit for this family.   Ms. Rasmussen questions were answered to her satisfaction today. Our contact information was provided should additional questions or concerns arise. Thank you for the referral and allowing [place] to share in the care of your patient.   Faith Rogue, MS, [facility] Genetic Counselor [email or web address] Phone: [phone]  The patient was seen for a total of 40 minutes in face-to-face genetic counseling.  Drs. Magrinat, Lindi Adie and/or Burr Medico were available for discussion regarding this case.   _______________________________________________________________________ For Office Staff:  Number of people involved in session: 1 Was an Intern/ student involved with case: no

## 2020-02-12 DIAGNOSIS — I10 Essential (primary) hypertension: Secondary | ICD-10-CM | POA: Diagnosis not present

## 2020-02-17 DIAGNOSIS — E1165 Type 2 diabetes mellitus with hyperglycemia: Secondary | ICD-10-CM | POA: Diagnosis not present

## 2020-03-03 ENCOUNTER — Telehealth: Payer: Self-pay | Admitting: *Deleted

## 2020-03-03 NOTE — Telephone Encounter (Signed)
Patient called and scheduled a follow up appt for August

## 2020-03-18 ENCOUNTER — Other Ambulatory Visit: Payer: Self-pay | Admitting: Family Medicine

## 2020-03-18 DIAGNOSIS — Z1231 Encounter for screening mammogram for malignant neoplasm of breast: Secondary | ICD-10-CM

## 2020-04-01 ENCOUNTER — Other Ambulatory Visit: Payer: Self-pay

## 2020-04-01 ENCOUNTER — Ambulatory Visit
Admission: RE | Admit: 2020-04-01 | Discharge: 2020-04-01 | Disposition: A | Discharge status: Home / Self Care | Payer: Federal, State, Local not specified - PPO | Source: Ambulatory Visit | Attending: Family Medicine | Admitting: Family Medicine

## 2020-04-01 DIAGNOSIS — Z1231 Encounter for screening mammogram for malignant neoplasm of breast: Secondary | ICD-10-CM

## 2020-04-06 ENCOUNTER — Other Ambulatory Visit: Payer: Self-pay | Admitting: Family Medicine

## 2020-04-06 DIAGNOSIS — R928 Other abnormal and inconclusive findings on diagnostic imaging of breast: Secondary | ICD-10-CM

## 2020-04-07 ENCOUNTER — Other Ambulatory Visit: Payer: Self-pay

## 2020-04-07 ENCOUNTER — Ambulatory Visit
Admission: RE | Admit: 2020-04-07 | Discharge: 2020-04-07 | Disposition: A | Discharge status: Home / Self Care | Payer: Federal, State, Local not specified - PPO | Source: Ambulatory Visit | Attending: Family Medicine | Admitting: Family Medicine

## 2020-04-07 DIAGNOSIS — N6321 Unspecified lump in the left breast, upper outer quadrant: Secondary | ICD-10-CM | POA: Diagnosis not present

## 2020-04-07 DIAGNOSIS — R928 Other abnormal and inconclusive findings on diagnostic imaging of breast: Secondary | ICD-10-CM

## 2020-05-04 ENCOUNTER — Other Ambulatory Visit: Payer: Self-pay

## 2020-05-04 ENCOUNTER — Encounter: Payer: Self-pay | Admitting: Gynecologic Oncology

## 2020-05-04 ENCOUNTER — Inpatient Hospital Stay: Payer: Federal, State, Local not specified - PPO | Attending: Gynecologic Oncology | Admitting: Gynecologic Oncology

## 2020-05-04 VITALS — BP 137/83 | HR 71 | Temp 98.8°F | Resp 16 | Ht 69.75 in | Wt 275.0 lb

## 2020-05-04 DIAGNOSIS — Z90710 Acquired absence of both cervix and uterus: Secondary | ICD-10-CM

## 2020-05-04 DIAGNOSIS — Z90722 Acquired absence of ovaries, bilateral: Secondary | ICD-10-CM | POA: Insufficient documentation

## 2020-05-04 DIAGNOSIS — C541 Malignant neoplasm of endometrium: Secondary | ICD-10-CM | POA: Diagnosis not present

## 2020-05-04 DIAGNOSIS — Z79899 Other long term (current) drug therapy: Secondary | ICD-10-CM | POA: Insufficient documentation

## 2020-05-04 DIAGNOSIS — Z87891 Personal history of nicotine dependence: Secondary | ICD-10-CM | POA: Insufficient documentation

## 2020-05-04 DIAGNOSIS — I10 Essential (primary) hypertension: Secondary | ICD-10-CM | POA: Insufficient documentation

## 2020-05-04 DIAGNOSIS — Z6841 Body Mass Index (BMI) 40.0 and over, adult: Secondary | ICD-10-CM | POA: Diagnosis not present

## 2020-05-04 NOTE — Patient Instructions (Signed)
Please notify Dr Denman George at phone number [phone] if you notice vaginal bleeding, new pelvic or abdominal pains, bloating, feeling full easy, or a change in bladder or bowel function.   It is normal to have bleeding and brown/grey discharge after the exam today.  Please contact Dr Serita Grit office (at [phone]) in October to request an appointment with her for February, 2022.

## 2020-05-04 NOTE — Progress Notes (Signed)
Gynecologic Oncology Follow-up  Chief Complaint:  Chief Complaint  Patient presents with  . Endometrial cancer [facility])    Assessment/Plan:  [name]  is a 64 y.o.  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 MSI high (MLH1 and PMS2 loss of nuclear expression).   Pathology revealed low risk factors for recurrence, therefore no adjuvant therapy was recommended according to NCCN guidelines.  I discussed risk for recurrence and typical symptoms encouraged her to notify us of these should they develop between visits.  I recommend she continue to have follow-up every 6 months for 5 years in accordance with NCCN guidelines. Those visits should include symptom assessment, physical exam and pelvic examination. Pap smears are not indicated or recommended in the routine surveillance of endometrial cancer.  Declined genetic testing.   HPI: [name] is a 64 year old P4 who was seen in consultation at the request of Dr Sandra Hunt for evaluation of endometrial adenocarcinoma (FIGO grade 2).    The patient reported having menstrual-like cyclical bleeding for approximately 2 years beginning in 2018.  In April 2020 she then began passing heavy clots for approximately 3 months.  With subsequent lighter vaginal persistent daily bleeding.  She was eventually seen by Dr. Nelda Hunt who performed a transvaginal ultrasound scan on July 30, 2019 which revealed a uterus measuring 11.6 x 7.2 x 6.7 cm.  The endometrium was 6 mm in thickness.  The right and left ovaries were grossly normal.  Due to the endometrial thickening she underwent an office transvaginal biopsy on August 06, 2019.  This revealed FIGO grade 2 endometrioid adenocarcinoma.  The patient's obstetric history significant for 4 prior vaginal births.  She has had a tubal ligation and a diagnostic laparoscopy for what was thought to be a possible ectopic but was not.  Additionally her  surgical history is remarkable for an open appendectomy.  She is morbidly obese with a BMI of 42 kg meters squared.  She has hypertension but no diabetes mellitus.  She is an ex-smoker but quit in 1983.  She has a family history significant for father with leukemia and a mother with kidney cancer.  She is married and lives with her husband who is of good health.  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  She helps with her brother and her mother with activities of daily living.  On 09/28/19 she underwent robotic assisted total hysterectomy with BSO and SLN biopsy for a uterus >250gm with minilaparotomy for specimen delivery.  Intraoperative findings were significant for a 12cm bulky uterus without gross extrauterine disease, bulky lower uterine segment and endocervix with no gross cervical disease, no suspicious nodes.  Final pathology revealed a FIGO grade 1 endometrioid endometrial cancer with inner half (0.4 of 2.5cm) myometrial invasion. LVSI (focal) was present. There was no cervical or nodal involvement. MSI high (loss of nuclear expression of MLH1 and PMS2).   She was determined to have stage IA grade 1 cancer with low risk disease (only focal LVSI) and in accordance with NCCN guidelines, no adjuvant therapy was recommended.  She saw genetics counselors postop but declined genetic testing.   Interval Hx: She is doing well postop with no complaints.   Current Meds:  Outpatient Encounter Medications as of 05/04/2020  Medication Sig  . lisinopril (ZESTRIL) 20 MG tablet Take 20 mg by mouth daily.  . Multiple Vitamin (MULTIVITAMIN WITH MINERALS) TABS tablet Take 1 tablet  by mouth daily.  Marland Kitchen PROAIR RESPICLICK 791 (90 Base) MCG/ACT AEPB Inhale 1 puff into the lungs every 4 (four) hours as needed (wheezing/shortness of breath).   . Coconut Oil 1000 MG CAPS Take 2,000 mg by mouth 2 (two) times daily. For Bursitis (Patient not taking: Reported on 05/04/2020)   No  facility-administered encounter medications on file as of 05/04/2020.    Allergy:  Allergies  Allergen Reactions  . Other     Opioids-nausea  . Sulfacetamide Sodium-Sulfur Rash    Social Hx:   Social History   Socioeconomic History  . Marital status: Married    Spouse name: Not on file  . Number of children: Not on file  . Years of education: Not on file  . Highest education level: Not on file  Occupational History  . Not on file  Tobacco Use  . Smoking status: Former Smoker    Quit date: 1983    Years since quitting: 38.6  . Smokeless tobacco: Never Used  Vaping Use  . Vaping Use: Never used  Substance and Sexual Activity  . Alcohol use: Not Currently  . Drug use: Never  . Sexual activity: Yes    Birth control/protection: Other-see comments    Comment: tubal ligation  Other Topics Concern  . Not on file  Social History Narrative  . Not on file   Social Determinants of Health   Financial Resource Strain:   . Difficulty of Paying Living Expenses:   Food Insecurity:   . Worried About Charity fundraiser in the Last Year:   . Arboriculturist in the Last Year:   Transportation Needs:   . Film/video editor (Medical):   Marland Kitchen Lack of Transportation (Non-Medical):   Physical Activity:   . Days of Exercise per Week:   . Minutes of Exercise per Session:   Stress:   . Feeling of Stress :   Social Connections:   . Frequency of Communication with Friends and Family:   . Frequency of Social Gatherings with Friends and Family:   . Attends Religious Services:   . Active Member of Clubs or Organizations:   . Attends Archivist Meetings:   Marland Kitchen Marital Status:   Intimate Partner Violence:   . Fear of Current or Ex-Partner:   . Emotionally Abused:   Marland Kitchen Physically Abused:   . Sexually Abused:     Past Surgical Hx:  Past Surgical History:  Procedure Laterality Date  . APPENDECTOMY    . EYE SURGERY     laser surgery for glaucoma-closed angle glaucoma  .  laporoscopy N/A   . ROBOTIC ASSISTED TOTAL HYSTERECTOMY WITH BILATERAL SALPINGO OOPHERECTOMY N/A 09/28/2019   Procedure: XI ROBOTIC ASSISTED TOTAL HYSTERECTOMY FOR UTERUS > THEN 250G WITH BILATERAL SALPINGO OOPHORECTOMY;  Surgeon: Everitt Amber, MD;  Location: [facility];  Service: Gynecology;  Laterality: N/A;  . SENTINEL NODE BIOPSY N/A 09/28/2019   Procedure: SENTINEL LYMPH NODE BIOPSY;  Surgeon: Everitt Amber, MD;  Location: [facility];  Service: Gynecology;  Laterality: N/A;  . TONSILLECTOMY Bilateral   . TUBAL LIGATION      Past Medical Hx:  Past Medical History:  Diagnosis Date  . Allergy   . Asthma    allergic  . Cancer [facility])    endometrial cancer   . Depression   . Family history of kidney cancer   . Family history of leukemia   . Glaucoma   . Headache    migraines-but none in many  years  . Hearing loss   . Hypertension   . PONV (postoperative nausea and vomiting)   . Pre-diabetes     Past Gynecological History:  See HPI No LMP recorded. Patient is postmenopausal.  Family Hx:  Family History  Problem Relation Age of Onset  . Leukemia Father   . Diabetes Father   . Diabetes Maternal Grandmother   . Kidney cancer Maternal Grandmother     Review of Systems:  Constitutional  Feels well,    ENT Normal appearing ears and nares bilaterally Skin/Breast  No rash, sores, jaundice, itching, dryness Cardiovascular  No chest pain, shortness of breath, or edema  Pulmonary  No cough or wheeze.  Gastro Intestinal  No nausea, vomitting, or diarrhoea. No bright red blood per rectum, no abdominal pain, change in bowel movement, or constipation.  Genito Urinary  No frequency, urgency, dysuria, + postmenopausal bleeding and uterine cramps Musculo Skeletal  No myalgia, arthralgia, joint swelling or pain  Neurologic  No weakness, numbness, change in gait,  Psychology  No depression, anxiety, insomnia.   Vitals:  Blood pressure 137/83, pulse 71, temperature 98.8 F (37.1 C),  temperature source Tympanic, resp. rate 16, height 5' 9.75" (1.772 m), weight 275 lb (124.7 kg), SpO2 100 %.  Physical Exam: WD in NAD Neck  Supple NROM, without any enlargements.  Lymph Node Survey No cervical supraclavicular or inguinal adenopathy Cardiovascular  Pulse normal rate, regularity and rhythm. S1 and S2 normal.  Lungs  Clear to auscultation bilateraly, without wheezes/crackles/rhonchi. Good air movement.  Skin  No rash/lesions/breakdown  Psychiatry  Alert and oriented to person, place, and time  Abdomen  Normoactive bowel sounds, abdomen soft, non-tender and obese without evidence of hernia. Well healed incisions Back No CVA tenderness Genito Urinary  Vaginal cuff with granulation tissue - silver nitrate applied to ablate this.  Rectal  deferred Extremities  No bilateral cyanosis, clubbing or edema.  Thereasa Solo, MD  05/04/2020, 3:19 PM

## 2020-08-15 DIAGNOSIS — Z23 Encounter for immunization: Secondary | ICD-10-CM | POA: Diagnosis not present

## 2020-08-15 DIAGNOSIS — J309 Allergic rhinitis, unspecified: Secondary | ICD-10-CM | POA: Diagnosis not present

## 2020-08-15 DIAGNOSIS — I10 Essential (primary) hypertension: Secondary | ICD-10-CM | POA: Diagnosis not present

## 2020-08-15 DIAGNOSIS — E1169 Type 2 diabetes mellitus with other specified complication: Secondary | ICD-10-CM | POA: Diagnosis not present

## 2020-09-20 ENCOUNTER — Encounter (HOSPITAL_COMMUNITY): Payer: Self-pay | Admitting: Emergency Medicine

## 2020-09-20 DIAGNOSIS — C541 Malignant neoplasm of endometrium: Secondary | ICD-10-CM | POA: Insufficient documentation

## 2020-09-20 DIAGNOSIS — I10 Essential (primary) hypertension: Secondary | ICD-10-CM | POA: Insufficient documentation

## 2020-09-20 DIAGNOSIS — Z87891 Personal history of nicotine dependence: Secondary | ICD-10-CM | POA: Insufficient documentation

## 2020-09-20 DIAGNOSIS — J45909 Unspecified asthma, uncomplicated: Secondary | ICD-10-CM | POA: Insufficient documentation

## 2020-09-20 DIAGNOSIS — Z79899 Other long term (current) drug therapy: Secondary | ICD-10-CM | POA: Insufficient documentation

## 2020-09-20 DIAGNOSIS — E860 Dehydration: Secondary | ICD-10-CM | POA: Insufficient documentation

## 2020-09-20 DIAGNOSIS — R140 Abdominal distension (gaseous): Secondary | ICD-10-CM | POA: Diagnosis not present

## 2020-09-20 LAB — COMPREHENSIVE METABOLIC PANEL
ALT: 28 U/L (ref 0–44)
AST: 34 U/L (ref 15–41)
Albumin: 3.4 g/dL — ABNORMAL LOW (ref 3.5–5.0)
Alkaline Phosphatase: 263 U/L — ABNORMAL HIGH (ref 38–126)
Anion gap: 12 (ref 5–15)
BUN: 16 mg/dL (ref 8–23)
CO2: 26 mmol/L (ref 22–32)
Calcium: 9.6 mg/dL (ref 8.9–10.3)
Chloride: 99 mmol/L (ref 98–111)
Creatinine, Ser: 0.85 mg/dL (ref 0.44–1.00)
GFR, Estimated: >60 mL/min (ref >60)
Glucose, Bld: 145 mg/dL — ABNORMAL HIGH (ref 70–99)
Potassium: 5.3 mmol/L — ABNORMAL HIGH (ref 3.5–5.1)
Sodium: 137 mmol/L (ref 135–145)
Total Bilirubin: 0.5 mg/dL (ref 0.3–1.2)
Total Protein: 8.2 g/dL — ABNORMAL HIGH (ref 6.5–8.1)

## 2020-09-20 LAB — URINALYSIS, ROUTINE W REFLEX MICROSCOPIC
Bilirubin Urine: NEGATIVE
Glucose, UA: NEGATIVE mg/dL
Hgb urine dipstick: NEGATIVE
Ketones, ur: 5 mg/dL — AB
Leukocytes,Ua: NEGATIVE
Nitrite: NEGATIVE
Protein, ur: 100 mg/dL — AB
Renal Epithelial: <1
Specific Gravity, Urine: 1.029 (ref 1.005–1.030)
pH: 5 (ref 5.0–8.0)

## 2020-09-20 LAB — CBC
HCT: 43.4 % (ref 36.0–46.0)
Hemoglobin: 13.4 g/dL (ref 12.0–15.0)
MCH: 28.9 pg (ref 26.0–34.0)
MCHC: 30.9 g/dL (ref 30.0–36.0)
MCV: 93.5 fL (ref 80.0–100.0)
Platelets: 553 10*3/uL — ABNORMAL HIGH (ref 150–400)
RBC: 4.64 MIL/uL (ref 3.87–5.11)
RDW: 16.3 % — ABNORMAL HIGH (ref 11.5–15.5)
WBC: 10.5 10*3/uL (ref 4.0–10.5)
nRBC: 0 % (ref 0.0–0.2)

## 2020-09-20 LAB — LIPASE, BLOOD: Lipase: 27 U/L (ref 11–51)

## 2020-09-20 NOTE — ED Triage Notes (Signed)
Per pt, states abdominal distention for over a month-states she is having normal BMs daily-states she was to follow up with GI but has not-states distention pressing on diaphragm causing her to cough

## 2020-09-21 ENCOUNTER — Encounter (HOSPITAL_COMMUNITY): Payer: Self-pay

## 2020-09-21 ENCOUNTER — Emergency Department (HOSPITAL_COMMUNITY): Payer: Federal, State, Local not specified - PPO

## 2020-09-21 ENCOUNTER — Emergency Department (HOSPITAL_COMMUNITY)
Admission: EM | Admit: 2020-09-21 | Discharge: 2020-09-21 | Disposition: A | Discharge status: Home / Self Care | Payer: Federal, State, Local not specified - PPO | Attending: Emergency Medicine | Admitting: Emergency Medicine

## 2020-09-21 DIAGNOSIS — C541 Malignant neoplasm of endometrium: Secondary | ICD-10-CM

## 2020-09-21 DIAGNOSIS — R140 Abdominal distension (gaseous): Secondary | ICD-10-CM | POA: Diagnosis not present

## 2020-09-21 MED ORDER — OXYCODONE-ACETAMINOPHEN 5-325 MG PO TABS: 2.0000 | ORAL_TABLET | ORAL | 0 refills | Status: DC | PRN

## 2020-09-21 MED ORDER — ONDANSETRON 4 MG PO TBDP: 4.0000 mg | ORAL_TABLET | Freq: Three times a day (TID) | ORAL | 0 refills | Status: DC | PRN

## 2020-09-21 MED ORDER — SODIUM CHLORIDE 0.9 % IV SOLN
1.0000 g | Freq: Once | INTRAVENOUS | Status: AC
  Administered 2020-09-21: 1 g via INTRAVENOUS
  Filled 2020-09-21: qty 10

## 2020-09-21 MED ORDER — LACTATED RINGERS IV BOLUS
1000.0000 mL | Freq: Once | INTRAVENOUS | Status: AC
  Administered 2020-09-21: 1000 mL via INTRAVENOUS

## 2020-09-21 MED ORDER — FENTANYL CITRATE (PF) 100 MCG/2ML IJ SOLN
50.0000 ug | Freq: Once | INTRAMUSCULAR | Status: AC
  Administered 2020-09-21: 50 ug via INTRAVENOUS
  Filled 2020-09-21: qty 2

## 2020-09-21 MED ORDER — ONDANSETRON HCL 4 MG/2ML IJ SOLN
4.0000 mg | Freq: Once | INTRAMUSCULAR | Status: AC
  Administered 2020-09-21: 4 mg via INTRAVENOUS
  Filled 2020-09-21: qty 2

## 2020-09-21 MED ORDER — IOHEXOL 300 MG/ML  SOLN
100.0000 mL | Freq: Once | INTRAMUSCULAR | Status: AC | PRN
  Administered 2020-09-21: 100 mL via INTRAVENOUS

## 2020-09-21 NOTE — ED Provider Notes (Signed)
[facility] DEPT Provider Note   CSN: [id number] Arrival date & time: 09/20/20  1335     History Chief Complaint  Patient presents with  . Abdominal Pain    Sandra Hunt is a 65 y.o. female.  65 year old female with past medical history of endometrial cancer status post resection about a year ago the presents to the emergency department today with worsening abdominal pain distention.  Patient states for approximately the last month her abdomen is become more more distended.  Over the last week she had a couple episodes of emesis when trying to eat anything substantial.  Patient states that she has pain around her previous incision site but not otherwise.  No fevers.  No change in bowel meds.  She states that her urine is darker than normal but she thinks is because she is dehydrated.  Has not seen body for this problem.  States that she has some type of testing 6 months ago with [place] that she had some cancer in her lymph system but she is not clear on that.   Abdominal Pain      Past Medical History:  Diagnosis Date  . Allergy   . Asthma    allergic  . Cancer [facility])    endometrial cancer   . Depression   . Family history of kidney cancer   . Family history of leukemia   . Glaucoma   . Headache    migraines-but none in many years  . Hearing loss   . Hypertension   . PONV (postoperative nausea and vomiting)   . Pre-diabetes     Patient Active Problem List   Diagnosis Date Noted  . Family history of leukemia   . Family history of kidney cancer   . 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 Endometrial cancer ([place]) 08/17/2019  . Morbid obesity with BMI of 40.0-44.9, adult ([place]) 08/17/2019    Past Surgical History:  Procedure Laterality Date  . APPENDECTOMY    . EYE SURGERY     laser surgery for glaucoma-closed angle glaucoma  . laporoscopy N/A   . ROBOTIC ASSISTED TOTAL  HYSTERECTOMY WITH BILATERAL SALPINGO OOPHERECTOMY N/A 09/28/2019   Procedure: XI ROBOTIC ASSISTED TOTAL HYSTERECTOMY FOR UTERUS > THEN 250G WITH BILATERAL SALPINGO OOPHORECTOMY;  Surgeon: Everitt Amber, MD;  Location: [facility];  Service: Gynecology;  Laterality: N/A;  . SENTINEL NODE BIOPSY N/A 09/28/2019   Procedure: SENTINEL LYMPH NODE BIOPSY;  Surgeon: Everitt Amber, MD;  Location: [facility];  Service: Gynecology;  Laterality: N/A;  . TONSILLECTOMY Bilateral   . TUBAL LIGATION       OB History   No obstetric history on file.     Family History  Problem Relation Age of Onset  . Leukemia Father   . Diabetes Father   . Diabetes Maternal Grandmother   . Kidney cancer Maternal Grandmother     Social History   Tobacco Use  . Smoking status: Former Smoker    Quit date: 1983    Years since quitting: 39.0  . Smokeless tobacco: Never Used  Vaping Use  . Vaping Use: Never used  Substance Use Topics  . Alcohol use: Not Currently  . Drug use: Never    Home Medications Prior to Admission medications   Medication Sig Start Date End Date Taking? Authorizing Provider  lisinopril (ZESTRIL) 20 MG tablet Take 20 mg by mouth daily. 08/31/19  Yes [provider]  montelukast (SINGULAIR) 10  MG tablet Take 10 mg by mouth daily. 08/15/20  Yes [provider]  ondansetron (ZOFRAN ODT) 4 MG disintegrating tablet Take 1 tablet (4 mg total) by mouth every 8 (eight) hours as needed for vomiting. 13m ODT q4 hours prn nausea/vomit 09/21/20  Yes Jymir Dunaj, JCorene Cornea MD  oxyCODONE-acetaminophen (PERCOCET) 5-325 MG tablet Take 2 tablets by mouth every 4 (four) hours as needed. 09/21/20  Yes Anslee Micheletti, JCorene Cornea MD  PROAIR RESPICLICK 1468(90 Base) MCG/ACT AEPB Inhale 1 puff into the lungs every 4 (four) hours as needed (wheezing/shortness of breath).  07/14/19  Yes [provider]    Allergies    Other and Sulfacetamide sodium-sulfur  Review of Systems   Review of Systems  Gastrointestinal: Positive  for abdominal pain.  All other systems reviewed and are negative.   Physical Exam Updated Vital Signs BP (!) 172/96 (BP Location: Left Arm)   Pulse 80   Temp 99 F (37.2 C) (Oral)   Resp 15   SpO2 96%   Physical Exam Vitals and nursing note reviewed.  Constitutional:      Appearance: She is well-developed and well-nourished.  HENT:     Head: Normocephalic and atraumatic.     Mouth/Throat:     Mouth: Mucous membranes are moist.  Eyes:     Pupils: Pupils are equal, round, and reactive to light.  Cardiovascular:     Rate and Rhythm: Normal rate and regular rhythm.     Pulses: Normal pulses.  Pulmonary:     Effort: No respiratory distress.     Breath sounds: No stridor.  Abdominal:     General: There is no distension.     Palpations: Abdomen is soft.     Tenderness: There is generalized abdominal tenderness (without peritonitis).  Musculoskeletal:        General: No swelling or tenderness. Normal range of motion.     Cervical back: Normal range of motion.  Skin:    General: Skin is warm and dry.  Neurological:     General: No focal deficit present.     Mental Status: She is alert.  Psychiatric:        Mood and Affect: Mood normal.     ED Results / Procedures / Treatments   Labs (all labs ordered are listed, but only abnormal results are displayed) Labs Reviewed  COMPREHENSIVE METABOLIC PANEL - Abnormal; Notable for the following components:      Result Value   Potassium 5.3 (*)    Glucose, Bld 145 (*)    Total Protein 8.2 (*)    Albumin 3.4 (*)    Alkaline Phosphatase 263 (*)    All other components within normal limits  CBC - Abnormal; Notable for the following components:   RDW 16.3 (*)    Platelets 553 (*)    All other components within normal limits  URINALYSIS, ROUTINE W REFLEX MICROSCOPIC - Abnormal; Notable for the following components:   Color, Urine AMBER (*)    APPearance HAZY (*)    Ketones, ur 5 (*)    Protein, ur 100 (*)    Bacteria, UA MANY  (*)    All other components within normal limits  URINE CULTURE  LIPASE, BLOOD    EKG None  Radiology CT ABDOMEN PELVIS W CONTRAST  Result Date: 09/21/2020 CLINICAL DATA:  Abdominal distension for 1 month, history of endometrial cancer EXAM: CT ABDOMEN AND PELVIS WITH CONTRAST TECHNIQUE: Multidetector CT imaging of the abdomen and pelvis was performed using the  standard protocol following bolus administration of intravenous contrast. CONTRAST:  128m OMNIPAQUE IOHEXOL 300 MG/ML  SOLN COMPARISON:  07/30/2019 FINDINGS: Lower chest: No acute pleural or parenchymal lung disease. Hepatobiliary: Numerous serosal implants are seen along the liver, consistent with metastatic disease in a patient with a history of endometrial cancer. Largest of these inferior right lobe liver measuring 3.3 x 2.9 cm. There are no focal liver parenchymal abnormalities. The gallbladder is unremarkable. Pancreas: Unremarkable. No pancreatic ductal dilatation or surrounding inflammatory changes. Spleen: Normal in size without focal abnormality. Adrenals/Urinary Tract: Adrenal glands are unremarkable. Kidneys are normal, without renal calculi, focal lesion, or hydronephrosis. Bladder is unremarkable. Stomach/Bowel: No bowel obstruction or ileus. The appendix is surgically absent. No bowel wall thickening or inflammatory change. Vascular/Lymphatic: Minimal atherosclerosis of the aorta. No discrete adenopathy. Reproductive: Uterus and adnexal structures are surgically absent. Other: Soft tissue masses are seen throughout the mesentery, consistent with metastatic disease and peritoneal carcinomatosis. Omental caking in the ventral abdomen measures up to 5.1 cm in thickness. Soft tissue nodules are seen within the lower pelvis as well. There is free fluid throughout the abdomen and pelvis. No free intraperitoneal gas. No abdominal wall hernia. Musculoskeletal: No acute or destructive bony lesions. 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 Reconstructed images demonstrate no additional findings. IMPRESSION: 1. Findings of diffuse peritoneal carcinomatosis, with serosal implants along the liver, omental caking, and multiple mesenteric soft tissue nodules as above. Findings are compatible with metastatic endometrial cancer. 2. Abdominal and pelvic ascites. 3.  Aortic Atherosclerosis (ICD10-I70.0). Electronically Signed   By: MRanda NgoM.D.   On: 09/21/2020 02:28    Procedures Procedures (including critical care time)  Medications Ordered in ED Medications  lactated ringers bolus 1,000 mL (0 mLs Intravenous Stopped 09/21/20 0311)  cefTRIAXone (ROCEPHIN) 1 g in sodium chloride 0.9 % 100 mL IVPB (0 g Intravenous Stopped 09/21/20 0311)  fentaNYL (SUBLIMAZE) injection 50 mcg (50 mcg Intravenous Given 09/21/20 0125)  ondansetron (ZOFRAN) injection 4 mg (4 mg Intravenous Given 09/21/20 0125)  iohexol (OMNIPAQUE) 300 MG/ML solution 100 mL (100 mLs Intravenous Contrast Given 09/21/20 0205)    ED Course  I have reviewed the triage vital signs and the nursing notes.  Pertinent labs & imaging results that were available during my care of the patient were reviewed by me and considered in my medical decision making (see chart for details).    MDM Rules/Calculators/A&P                         Concern for SBO vs ascites with recurrent malignancy. CT/symptomatic treatment.  Symptoms improved. Ct with worsening malignancy.   Final Clinical Impression(s) / ED Diagnoses Final diagnoses:  Endometrial carcinoma ([place]    Rx / DC Orders ED Discharge Orders         Ordered    oxyCODONE-acetaminophen (PERCOCET) 5-325 MG tablet  Every 4 hours PRN        09/21/20 0306    ondansetron (ZOFRAN ODT) 4 MG disintegrating tablet  Every 8 hours PRN        09/21/20 0306           Arieon Corcoran, JCorene Cornea MD 09/21/20 [phone]

## 2020-09-23 LAB — URINE CULTURE: Culture: 40000 — AB

## 2020-09-25 ENCOUNTER — Telehealth: Payer: Self-pay | Admitting: Emergency Medicine

## 2020-09-25 NOTE — Telephone Encounter (Signed)
Post ED Visit - Positive Culture Follow-up  Culture report reviewed by antimicrobial stewardship pharmacist: [facility] Team []  Nathan Batchelder, Pharm.D. []  [address], Pharm.D., BCPS AQ-ID []  Heide Guile, Pharm.D., BCPS []  Parks Neptune, Pharm.D., BCPS []  [place], Pharm.D., BCPS, AAHIVP []  [place], Pharm.D., BCPS, AAHIVP []  Legrand Como, PharmD, BCPS []  Salome Arnt, PharmD, BCPS []  Johnnette Gourd, PharmD, BCPS []  Hughes Better, PharmD []  Leeroy Cha, PharmD, BCPS []  Laqueta Linden, PharmD  [place] Team []  [address], PharmD []  Leodis Sias, PharmD []  Lindell Spar, PharmD []  Royetta Asal, Rph []  Graylin Shiver) Rema Fendt, PharmD []  Glennon Mac, PharmD []  Arlyn Dunning, PharmD []  Netta Cedars, PharmD []  Dia Sitter, PharmD []  Leone Haven, PharmD []  Gretta Arab, PharmD [x]  Theodis Shove, PharmD []  Peggyann Juba, PharmD   Positive urine culture No further patient follow-up is required at this time.  Reuel Boom Trishelle Devora 09/25/2020, 4:20 PM

## 2020-09-27 ENCOUNTER — Encounter: Payer: Self-pay | Admitting: Gynecologic Oncology

## 2020-09-27 ENCOUNTER — Telehealth: Payer: Self-pay

## 2020-09-27 ENCOUNTER — Other Ambulatory Visit: Payer: Self-pay | Admitting: Gynecologic Oncology

## 2020-09-27 DIAGNOSIS — G893 Neoplasm related pain (acute) (chronic): Secondary | ICD-10-CM

## 2020-09-27 MED ORDER — PROMETHAZINE HCL 12.5 MG PO TABS: 12.5000 mg | ORAL_TABLET | Freq: Four times a day (QID) | ORAL | 0 refills | Status: DC | PRN

## 2020-09-27 MED ORDER — OXYCODONE-ACETAMINOPHEN 5-325 MG PO TABS: 1.0000 | ORAL_TABLET | ORAL | 0 refills | Status: DC | PRN

## 2020-09-27 NOTE — Progress Notes (Signed)
See RN note. Pain medication refilled for cancer related pain. Pt to be seen in the office tomorrow for evaluation.

## 2020-09-27 NOTE — Telephone Encounter (Signed)
During medication review pt noted that the nausea medication was not covered by her insurance and did not hear back from pharmacy. She is out of the Oxycodone since the weekend.   She asked if our office could call in medication for pain and nausea. Reviewed with Joylene John, NP. Told Ms Brickner that Lenna Sciara will send in a few Oxycodone tabs and phenergan tabs to [facility]. Discussed not taking them together as both are very sedating. Suggested that she take the pain medication first as she stated the pain is causing the nausea. Pt verbalized understanding.

## 2020-09-28 ENCOUNTER — Encounter: Payer: Self-pay | Admitting: Gynecologic Oncology

## 2020-09-28 ENCOUNTER — Encounter (HOSPITAL_COMMUNITY): Payer: Self-pay | Admitting: Radiology

## 2020-09-28 ENCOUNTER — Other Ambulatory Visit: Payer: Self-pay | Admitting: Hematology and Oncology

## 2020-09-28 ENCOUNTER — Other Ambulatory Visit: Payer: Self-pay

## 2020-09-28 ENCOUNTER — Inpatient Hospital Stay: Payer: Federal, State, Local not specified - PPO | Attending: Gynecologic Oncology | Admitting: Gynecologic Oncology

## 2020-09-28 ENCOUNTER — Encounter: Payer: Self-pay | Admitting: Oncology

## 2020-09-28 VITALS — BP 151/76 | HR 59 | Temp 98.6°F | Resp 20 | Ht 70.0 in | Wt 246.0 lb

## 2020-09-28 DIAGNOSIS — Z7189 Other specified counseling: Secondary | ICD-10-CM

## 2020-09-28 DIAGNOSIS — Z90722 Acquired absence of ovaries, bilateral: Secondary | ICD-10-CM | POA: Insufficient documentation

## 2020-09-28 DIAGNOSIS — Z8542 Personal history of malignant neoplasm of other parts of uterus: Secondary | ICD-10-CM

## 2020-09-28 DIAGNOSIS — C801 Malignant (primary) neoplasm, unspecified: Secondary | ICD-10-CM | POA: Diagnosis not present

## 2020-09-28 DIAGNOSIS — R110 Nausea: Secondary | ICD-10-CM | POA: Diagnosis not present

## 2020-09-28 DIAGNOSIS — F32A Depression, unspecified: Secondary | ICD-10-CM | POA: Diagnosis not present

## 2020-09-28 DIAGNOSIS — C541 Malignant neoplasm of endometrium: Secondary | ICD-10-CM | POA: Diagnosis not present

## 2020-09-28 DIAGNOSIS — K5909 Other constipation: Secondary | ICD-10-CM | POA: Insufficient documentation

## 2020-09-28 DIAGNOSIS — Z90710 Acquired absence of both cervix and uterus: Secondary | ICD-10-CM | POA: Diagnosis not present

## 2020-09-28 DIAGNOSIS — R6881 Early satiety: Secondary | ICD-10-CM | POA: Diagnosis not present

## 2020-09-28 DIAGNOSIS — Z79899 Other long term (current) drug therapy: Secondary | ICD-10-CM | POA: Insufficient documentation

## 2020-09-28 DIAGNOSIS — Z87891 Personal history of nicotine dependence: Secondary | ICD-10-CM | POA: Diagnosis not present

## 2020-09-28 DIAGNOSIS — R140 Abdominal distension (gaseous): Secondary | ICD-10-CM | POA: Insufficient documentation

## 2020-09-28 DIAGNOSIS — C787 Secondary malignant neoplasm of liver and intrahepatic bile duct: Secondary | ICD-10-CM | POA: Diagnosis not present

## 2020-09-28 DIAGNOSIS — R630 Anorexia: Secondary | ICD-10-CM | POA: Insufficient documentation

## 2020-09-28 DIAGNOSIS — C786 Secondary malignant neoplasm of retroperitoneum and peritoneum: Secondary | ICD-10-CM

## 2020-09-28 DIAGNOSIS — Z5111 Encounter for antineoplastic chemotherapy: Secondary | ICD-10-CM | POA: Diagnosis not present

## 2020-09-28 DIAGNOSIS — G893 Neoplasm related pain (acute) (chronic): Secondary | ICD-10-CM | POA: Insufficient documentation

## 2020-09-28 DIAGNOSIS — I10 Essential (primary) hypertension: Secondary | ICD-10-CM | POA: Insufficient documentation

## 2020-09-28 DIAGNOSIS — R188 Other ascites: Secondary | ICD-10-CM | POA: Insufficient documentation

## 2020-09-28 DIAGNOSIS — Z6841 Body Mass Index (BMI) 40.0 and over, adult: Secondary | ICD-10-CM | POA: Insufficient documentation

## 2020-09-28 MED ORDER — ONDANSETRON 4 MG PO TBDP: 4.0000 mg | ORAL_TABLET | Freq: Three times a day (TID) | ORAL | 0 refills | Status: DC | PRN

## 2020-09-28 NOTE — Progress Notes (Signed)
Met with Thayer Headings after her appointment with Dr. Denman George.  Discussed her plan of care including chemotherapy to start next week and upcoming appointments.  Provided her with the [facility] folder and encouraged her to call with any questions or needs.

## 2020-09-28 NOTE — Progress Notes (Signed)
START ON PATHWAY REGIMEN - Uterine     A cycle is every 21 days:     Paclitaxel      Carboplatin   **Always confirm dose/schedule in your pharmacy ordering system**  Patient Characteristics: Endometrioid, Recurrent/Progressive Disease, Second Line, Systemic Recurrence, No Previous Chemotherapy Histology: Endometrioid Therapeutic Status: Recurrent or Progressive Disease Line of Therapy: Second Line Time to Recurrence: No Previous Chemotherapy Intent of Therapy: Non-Curative / Palliative Intent, Discussed with Patient

## 2020-09-28 NOTE — Progress Notes (Signed)
Gynecologic Oncology New Patient visit Chief Complaint:  Chief Complaint  Patient presents with  . Peritoneal carcinomatosis ([place])    New Issue  . History of endometrial cancer    Assessment/Plan:  [name]  is a 65 y.o.  year old with a history of stage IA grade 1 endometrioid endometrial cancer s/p staging on 09/28/19. MSI high (MLH1 and PMS2 loss of nuclear expression).   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 This is consistent with recurrent endometrial cancer versus new primary (such as primary peritoneal cancer). Given the unusual finding of widely metastatic disease after diagnosis of a low risk cancer, I recommend CT guided biopsy to confirm recurrent endometrial cancer. I am recommending that she see Dr Alvy Bimler to establish a treatment plan with cytotoxic chemotherapy with carboplatin and paclitaxel. She is a candidate for pembrolizumab in the future given her loss of MSI high tumor.   I counseled her regarding her symptoms of GI distress. She is not obstructed therefore I do not feel that she needs hospitalization for NGT decompression. Additionally her recent labs do not show signs of dehydration. I counseled her regarding how to optimize her nutrition in the face of poor ability to tolerate PO. She will drink nutritional supplements.   HPI: [name] is a 65 year old P4 who was seen as a follow-up from the Emergency Department for evaluation of peritoneal carcinomatosis approximately 1 year after surgery for a stage IA endometrial adenocarcinoma (FIGO grade 1) that was MSI high.    The patient reported having menstrual-like cyclical bleeding for approximately 2 years beginning in 2018.  In April 2020 she then began passing heavy clots for approximately 3 months.  With subsequent lighter vaginal persistent daily bleeding.  She was eventually seen by Dr. Nelda Hunt who performed a transvaginal ultrasound scan on July 30, 2019 which  revealed a uterus measuring 11.6 x 7.2 x 6.7 cm.  The endometrium was 6 mm in thickness.  The right and left ovaries were grossly normal.  Due to the endometrial thickening she underwent an office transvaginal biopsy on August 06, 2019.  This revealed FIGO grade 2 endometrioid adenocarcinoma.  On 09/28/19 she underwent robotic assisted total hysterectomy with BSO and SLN biopsy for a uterus >250gm with minilaparotomy for specimen delivery.  Intraoperative findings were significant for a 12cm bulky uterus without gross extrauterine disease, bulky lower uterine segment and endocervix with no gross cervical disease, no suspicious nodes.  Final pathology revealed a FIGO grade 1 endometrioid endometrial cancer with inner half (0.4 of 2.5cm) myometrial invasion. LVSI (focal) was present. There was no cervical or nodal involvement. MSI high (loss of nuclear expression of MLH1 and PMS2).   She was determined to have stage IA grade 1 cancer with low risk disease (only focal LVSI) and in accordance with NCCN guidelines, no adjuvant therapy was recommended.  She saw genetics counselors postop but declined genetic testing.   Interval Hx: She saw her PCP in November, 2021 for a routine check up and had no issues.  In December 2020 when she began experiencing abdominal bloating, gastrointestinal distress, and intermittent emesis.  She thought this was gas pains but as the symptoms became progressively worse she presented to the [facility] emergency department on September 20, 2020 at which time a CT scan was aborted to evaluate her gastrointestinal symptoms.  This revealed numerous serosal implants along the liver consistent with metastatic disease with the largest of which in the inferior right liver  lobe measuring 3.3 x 2.9 cm.  There was moderate volume ascites within the peritoneal cavity.  There were no pelvic masses identified.  There was soft tissue masses seen throughout the mesentery consistent with  metastatic disease and peritoneal carcinomatosis.  There was omental caking in the ventral abdomen measuring up to 5.1 cm.  No discrete adenopathy was identified.  These findings were consistent with metastatic and recurrent carcinoma with carcinomatosis metastatic to the peritoneal cavity and omentum.  She continues to have trouble with early satiety and managing her oral intake.  She continues to pass flatus and have bowel movements and her emesis is only intermittent.  Her labs from the ED revealed essentially normal CBC, her creatinine was normal, she had some hypoalbuminemia consistent with protein deficiency malnutrition.  Current Meds:  Outpatient Encounter Medications as of 09/28/2020  Medication Sig  . lisinopril (ZESTRIL) 20 MG tablet Take 20 mg by mouth daily.  . montelukast (SINGULAIR) 10 MG tablet Take 10 mg by mouth daily.  Marland Kitchen PROAIR RESPICLICK 734 (90 Base) MCG/ACT AEPB Inhale 1 puff into the lungs every 4 (four) hours as needed (wheezing/shortness of breath).   . ondansetron (ZOFRAN ODT) 4 MG disintegrating tablet Take 1 tablet (4 mg total) by mouth every 8 (eight) hours as needed for vomiting. 43m ODT q4 hours prn nausea/vomit (Patient not taking: Reported on 09/27/2020)  . rosuvastatin (CRESTOR) 20 MG tablet Take 20 mg by mouth every evening. (Patient not taking: Reported on 09/27/2020)  . [DISCONTINUED] oxyCODONE-acetaminophen (PERCOCET) 5-325 MG tablet Take 2 tablets by mouth every 4 (four) hours as needed. (Patient not taking: Reported on 09/27/2020)   No facility-administered encounter medications on file as of 09/28/2020.    Allergy:  Allergies  Allergen Reactions  . Other Nausea Only    Opioids-nausea  . Sulfacetamide Sodium-Sulfur Rash    Social Hx:   Social History   Socioeconomic History  . Marital status: Married    Spouse name: Not on file  . Number of children: Not on file  . Years of education: Not on file  . Highest education level: Not on file   Occupational History  . Not on file  Tobacco Use  . Smoking status: Former Smoker    Packs/day: 0.50    Years: 3.00    Pack years: 1.50    Types: Cigarettes    Quit date: 1983    Years since quitting: 39.0  . Smokeless tobacco: Never Used  Vaping Use  . Vaping Use: Never used  Substance and Sexual Activity  . Alcohol use: Not Currently  . Drug use: Never  . Sexual activity: Not Currently    Birth control/protection: Other-see comments    Comment: tubal ligation  Other Topics Concern  . Not on file  Social History Narrative  . Not on file   Social Determinants of Health   Financial Resource Strain: Not on file  Food Insecurity: Not on file  Transportation Needs: Not on file  Physical Activity: Not on file  Stress: Not on file  Social Connections: Not on file  Intimate Partner Violence: Not on file    Past Surgical Hx:  Past Surgical History:  Procedure Laterality Date  . APPENDECTOMY    . EYE SURGERY     laser surgery for glaucoma-closed angle glaucoma  . laporoscopy N/A   . ROBOTIC ASSISTED TOTAL HYSTERECTOMY WITH BILATERAL SALPINGO OOPHERECTOMY N/A 09/28/2019   Procedure: XI ROBOTIC ASSISTED TOTAL HYSTERECTOMY FOR UTERUS > THEN 250G WITH BILATERAL SALPINGO OOPHORECTOMY;  Surgeon: Everitt Amber, MD;  Location: [facility] ORS;  Service: Gynecology;  Laterality: N/A;  . SENTINEL NODE BIOPSY N/A 09/28/2019   Procedure: SENTINEL LYMPH NODE BIOPSY;  Surgeon: Everitt Amber, MD;  Location: [facility];  Service: Gynecology;  Laterality: N/A;  . TONSILLECTOMY Bilateral   . TUBAL LIGATION      Past Medical Hx:  Past Medical History:  Diagnosis Date  . Allergy   . Asthma    allergic  . Cancer [facility])    endometrial cancer   . Depression   . Family history of kidney cancer   . Family history of leukemia   . Glaucoma   . Headache    migraines-but none in many years  . Hearing loss   . Hypertension   . PONV (postoperative nausea and vomiting)   . Pre-diabetes     Past  Gynecological History:  See HPI No LMP recorded. Patient has had a hysterectomy.  Family Hx:  Family History  Problem Relation Age of Onset  . Leukemia Father   . Diabetes Father   . Diabetes Maternal Grandmother   . Kidney cancer Maternal Grandmother   . Colon cancer Cousin   . Ovarian cancer Neg Hx   . Breast cancer Neg Hx   . Prostate cancer Neg Hx   . Endometrial cancer Neg Hx   . Pancreatic cancer Neg Hx     Review of Systems:  Constitutional  Feels well,    ENT Normal appearing ears and nares bilaterally Skin/Breast  No rash, sores, jaundice, itching, dryness Cardiovascular  No chest pain, shortness of breath, or edema  Pulmonary  No cough or wheeze.  Gastro Intestinal  No nausea, vomitting, or diarrhoea. No bright red blood per rectum, no abdominal pain, change in bowel movement, or constipation.  Genito Urinary  No frequency, urgency, dysuria, + postmenopausal bleeding and uterine cramps Musculo Skeletal  No myalgia, arthralgia, joint swelling or pain  Neurologic  No weakness, numbness, change in gait,  Psychology  No depression, anxiety, insomnia.   Vitals:  Blood pressure (!) 151/76, pulse (!) 59, temperature 98.6 F (37 C), temperature source Tympanic, resp. rate 20, height 5' 10"  (1.778 m), weight 246 lb (111.6 kg), SpO2 98 %.  Physical Exam: WD in NAD Neck  Supple NROM, without any enlargements.  Lymph Node Survey No cervical supraclavicular or inguinal adenopathy Cardiovascular  Well perfused peripheries.  Lungs  No increased WOB  Skin  No rash/lesions/breakdown  Psychiatry  Alert and oriented to person, place, and time  Abdomen  Normoactive bowel sounds, abdomen soft, non-tender and obese without evidence of hernia. soft incisions. Her abdomen is moderately distended, tight. No palpable masses. Dull to percuss.  Back No CVA tenderness Genito Urinary  Vaginal cuff smooth, no visible lesions, no palpable pelvic masses.  Rectal   deferred Extremities  No bilateral cyanosis, clubbing or edema.  Thereasa Solo, MD  09/28/2020, 11:46 AM

## 2020-09-28 NOTE — Progress Notes (Signed)
Sandra Hunt Female, 65 y.o., Jul 28, 1956  MRN:  [id number] Phone:  [phone] Sandra Hunt)       PCP:  Donald Prose, MD Coverage:  Sherre Poot Blue [employer]/Federal Emp Ppo  Next Appt With Oncology 10/04/2020 at 1:30 PM           RE: CT Biopsy Received: Today Arne Cleveland, MD  Jillyn Hidden  Ok   CT core omental mass   DDH        Previous Messages   ----- Message -----  From: Garth Bigness D  Sent: 09/28/2020 12:23 PM EST  To: Ir Procedure Requests  Subject: CT Biopsy                     Procedure: CT Biopsy   Reason:  Peritoneal carcinomatosis, History of endometrial cancer, carcinomatosis, 1 year s/p hysterectomy for stage I endometrial cancer   History: CT, [place] in computer   Provider: Everitt Amber   Provider Contact:  [phone]

## 2020-09-28 NOTE — Patient Instructions (Signed)
There appears to be recurrence of your endometrial cancer. This would make it a stage IV cancer which needs to be treated with chemotherapy. Dr Denman George has set up an appointment with the chemotherapy doctor on Tuesday with a plan to start chemotherapy on Thursday. It is important to get a biopsy of your tumor to ensure that this is not a new cancer from a different organ. This will also be scheduled.  Continue to eat small volumes of food frequently. Continue to drink high protein supplemental drinks (sip on these all day) as they may be easier to digest than solid food. Keep hydrated but do not drink carbonated (fizzy) beverages. Do not drink large volumes of fluid at one time (sip on small volumes continuously) to prevent throwing up.

## 2020-09-30 NOTE — Progress Notes (Signed)
Pharmacist Chemotherapy Monitoring - Initial Assessment    Anticipated start date: 10/07/20  Regimen:  . Are orders appropriate based on the patient's diagnosis, regimen, and cycle? Yes . Does the plan date match the patient's scheduled date? Yes . Is the sequencing of drugs appropriate? Yes . Are the premedications appropriate for the patient's regimen? Yes . Prior Authorization for treatment is: Approved o If applicable, is the correct biosimilar selected based on the patient's insurance? not applicable  Organ Function and Labs: Marland Kitchen Are dose adjustments needed based on the patient's renal function, hepatic function, or hematologic function? No . Are appropriate labs ordered prior to the start of patient's treatment? Yes . Other organ system assessment, if indicated: N/A . The following baseline labs, if indicated, have been ordered: N/A  Dose Assessment: . Are the drug doses appropriate? Yes . Are the following correct: o Drug concentrations Yes o IV fluid compatible with drug Yes o Administration routes Yes o Timing of therapy Yes . If applicable, does the patient have documented access for treatment and/or plans for port-a-cath placement? no . If applicable, have lifetime cumulative doses been properly documented and assessed? yes Lifetime Dose Tracking  No doses have been documented on this patient for the following tracked chemicals: Doxorubicin, Epirubicin, Idarubicin, Daunorubicin, Mitoxantrone, Bleomycin, Oxaliplatin, Carboplatin, Liposomal Doxorubicin  o   Toxicity Monitoring/Prevention: . The patient has the following take home antiemetics prescribed: Ondansetron, Phenergan . The patient has the following take home medications prescribed: N/A . Medication allergies and previous infusion related reactions, if applicable, have been reviewed and addressed. Yes . The patient's current medication list has been assessed for drug-drug interactions with their chemotherapy regimen.  no significant drug-drug interactions were identified on review.  Order Review: . Are the treatment plan orders signed? No . Is the patient scheduled to see a provider prior to their treatment? No  I verify that I have reviewed each item in the above checklist and answered each question accordingly.   Kennith Center, Pharm.D., CPP 09/30/2020@6 :02 PM

## 2020-10-03 ENCOUNTER — Other Ambulatory Visit: Payer: Self-pay | Admitting: Radiology

## 2020-10-04 ENCOUNTER — Telehealth: Payer: Self-pay

## 2020-10-04 ENCOUNTER — Other Ambulatory Visit: Payer: Self-pay

## 2020-10-04 ENCOUNTER — Inpatient Hospital Stay: Payer: Federal, State, Local not specified - PPO

## 2020-10-04 ENCOUNTER — Other Ambulatory Visit: Payer: Self-pay | Admitting: Student

## 2020-10-04 ENCOUNTER — Inpatient Hospital Stay: Payer: Federal, State, Local not specified - PPO | Admitting: Hematology and Oncology

## 2020-10-04 ENCOUNTER — Inpatient Hospital Stay (HOSPITAL_BASED_OUTPATIENT_CLINIC_OR_DEPARTMENT_OTHER): Payer: Federal, State, Local not specified - PPO

## 2020-10-04 ENCOUNTER — Other Ambulatory Visit: Payer: Self-pay | Admitting: Hematology and Oncology

## 2020-10-04 ENCOUNTER — Encounter: Payer: Self-pay | Admitting: Hematology and Oncology

## 2020-10-04 VITALS — BP 113/65 | HR 103 | Temp 97.6°F | Resp 18 | Ht 70.0 in | Wt 241.6 lb

## 2020-10-04 DIAGNOSIS — C801 Malignant (primary) neoplasm, unspecified: Secondary | ICD-10-CM | POA: Diagnosis not present

## 2020-10-04 DIAGNOSIS — Z23 Encounter for immunization: Secondary | ICD-10-CM

## 2020-10-04 DIAGNOSIS — C786 Secondary malignant neoplasm of retroperitoneum and peritoneum: Secondary | ICD-10-CM | POA: Diagnosis not present

## 2020-10-04 DIAGNOSIS — C787 Secondary malignant neoplasm of liver and intrahepatic bile duct: Secondary | ICD-10-CM | POA: Diagnosis not present

## 2020-10-04 DIAGNOSIS — Z90710 Acquired absence of both cervix and uterus: Secondary | ICD-10-CM | POA: Diagnosis not present

## 2020-10-04 DIAGNOSIS — R110 Nausea: Secondary | ICD-10-CM

## 2020-10-04 DIAGNOSIS — C541 Malignant neoplasm of endometrium: Secondary | ICD-10-CM | POA: Diagnosis not present

## 2020-10-04 DIAGNOSIS — Z7189 Other specified counseling: Secondary | ICD-10-CM

## 2020-10-04 DIAGNOSIS — K5909 Other constipation: Secondary | ICD-10-CM | POA: Diagnosis not present

## 2020-10-04 DIAGNOSIS — G893 Neoplasm related pain (acute) (chronic): Secondary | ICD-10-CM | POA: Diagnosis not present

## 2020-10-04 DIAGNOSIS — Z8542 Personal history of malignant neoplasm of other parts of uterus: Secondary | ICD-10-CM | POA: Diagnosis not present

## 2020-10-04 DIAGNOSIS — Z90722 Acquired absence of ovaries, bilateral: Secondary | ICD-10-CM | POA: Diagnosis not present

## 2020-10-04 DIAGNOSIS — R630 Anorexia: Secondary | ICD-10-CM | POA: Diagnosis not present

## 2020-10-04 DIAGNOSIS — R140 Abdominal distension (gaseous): Secondary | ICD-10-CM | POA: Diagnosis not present

## 2020-10-04 DIAGNOSIS — R6881 Early satiety: Secondary | ICD-10-CM | POA: Diagnosis not present

## 2020-10-04 DIAGNOSIS — Z5111 Encounter for antineoplastic chemotherapy: Secondary | ICD-10-CM | POA: Diagnosis not present

## 2020-10-04 DIAGNOSIS — R188 Other ascites: Secondary | ICD-10-CM | POA: Diagnosis not present

## 2020-10-04 DIAGNOSIS — Z6841 Body Mass Index (BMI) 40.0 and over, adult: Secondary | ICD-10-CM

## 2020-10-04 LAB — CBC WITH DIFFERENTIAL (CANCER CENTER ONLY)
Abs Immature Granulocytes: 0.06 10*3/uL (ref 0.00–0.07)
Basophils Absolute: 0 10*3/uL (ref 0.0–0.1)
Basophils Relative: 0 %
Eosinophils Absolute: 0 10*3/uL (ref 0.0–0.5)
Eosinophils Relative: 0 %
HCT: 41.6 % (ref 36.0–46.0)
Hemoglobin: 13.4 g/dL (ref 12.0–15.0)
Immature Granulocytes: 1 %
Lymphocytes Relative: 13 %
Lymphs Abs: 1 10*3/uL (ref 0.7–4.0)
MCH: 28.5 pg (ref 26.0–34.0)
MCHC: 32.2 g/dL (ref 30.0–36.0)
MCV: 88.5 fL (ref 80.0–100.0)
Monocytes Absolute: 0.4 10*3/uL (ref 0.1–1.0)
Monocytes Relative: 5 %
Neutro Abs: 6.1 10*3/uL (ref 1.7–7.7)
Neutrophils Relative %: 81 %
Platelet Count: 435 10*3/uL — ABNORMAL HIGH (ref 150–400)
RBC: 4.7 MIL/uL (ref 3.87–5.11)
RDW: 17.1 % — ABNORMAL HIGH (ref 11.5–15.5)
WBC Count: 7.6 10*3/uL (ref 4.0–10.5)
nRBC: 0 % (ref 0.0–0.2)

## 2020-10-04 LAB — CMP (CANCER CENTER ONLY)
ALT: 14 U/L (ref 0–44)
AST: 56 U/L — ABNORMAL HIGH (ref 15–41)
Albumin: 2.8 g/dL — ABNORMAL LOW (ref 3.5–5.0)
Alkaline Phosphatase: 134 U/L — ABNORMAL HIGH (ref 38–126)
Anion gap: 15 (ref 5–15)
BUN: 21 mg/dL (ref 8–23)
CO2: 21 mmol/L — ABNORMAL LOW (ref 22–32)
Calcium: 9.5 mg/dL (ref 8.9–10.3)
Chloride: 106 mmol/L (ref 98–111)
Creatinine: 1.04 mg/dL — ABNORMAL HIGH (ref 0.44–1.00)
GFR, Estimated: >60 mL/min (ref >60)
Glucose, Bld: 127 mg/dL — ABNORMAL HIGH (ref 70–99)
Potassium: 4 mmol/L (ref 3.5–5.1)
Sodium: 142 mmol/L (ref 135–145)
Total Bilirubin: 0.3 mg/dL (ref 0.3–1.2)
Total Protein: 8.2 g/dL — ABNORMAL HIGH (ref 6.5–8.1)

## 2020-10-04 MED ORDER — OXYCODONE HCL 5 MG PO TABS
ORAL_TABLET | ORAL | Status: AC
  Filled 2020-10-04: qty 2

## 2020-10-04 MED ORDER — OXYCODONE HCL 5 MG PO TABS
10.0000 mg | ORAL_TABLET | ORAL | Status: DC | PRN
  Administered 2020-10-04: 10 mg via ORAL

## 2020-10-04 MED ORDER — DEXAMETHASONE 4 MG PO TABS: ORAL_TABLET | ORAL | 6 refills | Status: AC

## 2020-10-04 MED ORDER — PROMETHAZINE HCL 25 MG PO TABS: 25.0000 mg | ORAL_TABLET | Freq: Four times a day (QID) | ORAL | 1 refills | Status: AC | PRN

## 2020-10-04 MED ORDER — OXYCODONE HCL 15 MG PO TABS
15.0000 mg | ORAL_TABLET | ORAL | 0 refills | Status: AC | PRN
Start: 2020-10-04 — End: ?

## 2020-10-04 NOTE — Progress Notes (Signed)
[place] CONSULT NOTE  Patient Care Team: Donald Prose, MD as PCP - General (Family Medicine)  ASSESSMENT & PLAN:  Endometrial cancer [facility]) She is very ill with significant abdominal symptoms of carcinomatosis Her case will be presented at the next GYN oncology tumor board due to significant burden of disease seen so soon after surgery She is scheduled for biopsy tomorrow to confirm the disease  We reviewed the NCCN guidelines We discussed the role of chemotherapy. The intent is of palliative intent, if biopsy confirms similar disease of recurrence I do not feel strongly we need to wait for confirmation from biopsy The biopsy could be helpful for future use to guide prognosis and treatment  We discussed some of the risks, benefits, side-effects of carboplatin & Taxol. Treatment is intravenous, every 3 weeks x 6 cycles  Some of the short term side-effects included, though not limited to, including weight loss, life threatening infections, risk of allergic reactions, need for transfusions of blood products, nausea, vomiting, change in bowel habits, loss of hair, admission to hospital for various reasons, and risks of death.   Long term side-effects are also discussed including risks of infertility, permanent damage to nerve function, hearing loss, chronic fatigue, kidney damage with possibility needing hemodialysis, and rare secondary malignancy including bone marrow disorders.  The patient is aware that the response rates discussed earlier is not guaranteed.  After a long discussion, patient made an informed decision to proceed with the prescribed plan of care.   Patient education material was dispensed. We discussed premedication with dexamethasone before chemotherapy. Due to poor venous access, I recommend PICC line placement before surgery I plan to see her back Friday morning before the start date of treatment for supportive care  Morbid obesity with BMI of 40.0-44.9,  adult ([place]) She has class II obesity The calculated dose of chemotherapy is very high Plan to reduce paclitaxel to 140 mg per metered squared and carboplatin to AUC of 5   Nausea without vomiting This is due to carcinomatosis I refill her prescription of antiemetics She is at risk of dehydration I recommend IV fluids at home We will consult advanced home care for this  Cancer associated pain She has poorly controlled pain I plan to increase her pain medicine I will discontinue Percocet and switch her to oxycodone only She can continue to take acetaminophen as needed  Goals of care, counseling/discussion If biopsy confirmed recurrent cancer, treatment goal is strictly palliative in nature She is aware   Orders Placed This Encounter  Procedures  . Ambulatory referral to Home Health    Referral Priority:   Routine    Referral Type:   Home Health Care    Referral Reason:   Specialty Services Required    Requested Specialty:   [place]    Number of Visits Requested:   1    The total time spent in the appointment was 60 minutes encounter with patients including review of chart and various tests results, discussions about plan of care and coordination of care plan   All questions were answered. The patient knows to call the clinic with any problems, questions or concerns. No barriers to learning was detected.  Heath Lark, MD 1/18/20223:32 PM  CHIEF COMPLAINTS/PURPOSE OF CONSULTATION:  Recurrent uterine cancer, for further management  HISTORY OF PRESENTING ILLNESS:  Sandra Hunt 65 y.o. 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 at home She has 4 sons, the oldest son, Sandra Hunt is taking care of her and involved in transportation I spoke with Sandra Hunt briefly She was initially diagnosed with postmenopausal bleeding at the end of 2020 and underwent surgery last  year She was diagnosed with early stage I disease and does not require chemotherapy A few months ago, she started to have lower back pain, abdominal bloating, nausea and changes in bowel habits She had CT imaging performed which showed significant abdominal carcinomatosis worrisome for recurrence of disease   I have reviewed her chart and materials related to her cancer extensively and collaborated history with the patient. Summary of oncologic history is as follows: Oncology History  Endometrial cancer ([place])  08/17/2019 Initial Diagnosis   Endometrial cancer ([place]) The patient reported having menstrual-like cyclical bleeding for approximately 2 years beginning in 2018.  In April 2020 she then began passing heavy clots for approximately 3 months.  With subsequent lighter vaginal persistent daily bleeding.  She was eventually seen by Dr. Nelda Marseille who performed a transvaginal ultrasound scan on July 30, 2019 which revealed a uterus measuring 11.6 x 7.2 x 6.7 cm.  The endometrium was 6 mm in thickness.  The right and left ovaries were grossly normal.   Due to the endometrial thickening she underwent an office transvaginal biopsy on August 06, 2019.  This revealed FIGO grade 2 endometrioid adenocarcinoma.   09/28/2019 Surgery   Surgeon: Donaciano Eva    Assistants: Joylene John, NP (a provider assistant was necessary for tissue manipulation, management of robotic instrumentation, retraction and positioning due to the complexity of the case and hospital policies).    Anesthesia: General endotracheal anesthesia    Operation: Robotic-assisted laparoscopic total hysterectomy >250gm with bilateral salpingoophorectomy, SLN biopsy  Operative Findings:  12cm bulky uterus without gross extrauterine disease. Bulky lower uterine segment and endocervix, but no gross cervical disease, no suspicious nodes. Uterus weighed in OR - 381gm.     04/07/2020 Imaging   Intramammary lymph node in the LEFT  breast accounts for the mammographic abnormality. No mammographic or ultrasound evidence for malignancy.   09/21/2020 Imaging   1. Findings of diffuse peritoneal carcinomatosis, with serosal implants along the liver, omental caking, and multiple mesenteric soft tissue nodules as above. Findings are compatible with metastatic endometrial cancer. 2. Abdominal and pelvic ascites. 3.  Aortic Atherosclerosis (ICD10-I70.0).   09/27/2020 Pathology Results   A. LYMPH NODE, LEFT OBTURATOR, SENTINEL, BIOPSY:  - Lymph node, negative for carcinoma (0/1).  See comment   B. LYMPH NODE, RIGHT DEEP OBTURATOR, SENTINEL, BIOPSY:  - Lymph node, negative for carcinoma (0/1).  See comment   C. UTERUS, CERVIX, BILATERAL OVARIES AND FALLOPIAN TUBES:  - Endometrioid adenocarcinoma, FIGO grade 1, 12 cm, involving anterior and posterior endometrium in a carpet-like fashion  - Carcinoma invades into the myometrium for an approximate depth of 0.4 cm where the myometrial thickness is about 2.5 cm  - Lymphovascular invasion is present  - Adenomyosis  - Unremarkable cervix  - Unremarkable bilateral fallopian tubes and ovaries  - See oncology table   COMMENT:   A and B.   Immunohistochemical stain for pancytokeratin (AE1/AE3) is negative for metastatic carcinoma.   ONCOLOGY TABLE:   UTERUS, CARCINOMA OR CARCINOSARCOMA   Procedure: Total hysterectomy and bilateral salpingo-oophorectomy  Histologic type: Endometrioid adenocarcinoma  Histologic Grade: FIGO grade 1  Myometrial invasion:       Depth of invasion: 4 mm  Myometrial thickness: 25 mm  Uterine Serosa Involvement: Not identified  Cervical stromal involvement: Not identified  Extent of involvement of other organs: Not identified  Lymphovascular invasion: Present  Regional Lymph Nodes:       Examined:     2 Sentinel                               0 Non-sentinel                               2 Total        Lymph nodes with metastasis: 0         Isolated tumor cells (<0.2 mm): 0        Micrometastasis: (>0.2 mm and < 2.0 mm): 0        Macrometastasis: (>2.0 mm): 0        Extracapsular extension: Not applicable  Representative Tumor Block: C8  MMR / MSI testing: Will be ordered  Pathologic Stage Classification (pTNM, AJCC 8th edition):  pT1a, pN0  FIGO Stage (2015 FIGO Cancer Report):  IA    10/04/2020 Cancer Staging   Staging form: Corpus Uteri - Carcinoma and Carcinosarcoma, AJCC 8th Edition - Pathologic stage from 10/04/2020: FIGO Stage IA (pT1a, pN0, cM0) - Signed by Heath Lark, MD on 10/04/2020   10/04/2020 Cancer Staging   Staging form: Corpus Uteri - Carcinoma and Carcinosarcoma, AJCC 8th Edition - Clinical stage from 10/04/2020: FIGO Stage IVB (rcT3, cN0, cM1) - Signed by Heath Lark, MD on 10/04/2020    She is having about 5 out of 10 pain right now She ran out of pain medicine 5 days ago She was prescribed oxycodone/acetaminophen and that barely controlled her pain She continues to have abdominal bloating and difficulties tolerating oral intake She is attempting to drink as much fluid as possible She is feeling weak overall  MEDICAL HISTORY:  Past Medical History:  Diagnosis Date  . Allergy   . Asthma    allergic  . Cancer [facility])    endometrial cancer   . Depression   . Family history of kidney cancer   . Family history of leukemia   . Glaucoma   . Headache    migraines-but none in many years  . Hearing loss   . Hypertension   . PONV (postoperative nausea and vomiting)   . Pre-diabetes     SURGICAL HISTORY: Past Surgical History:  Procedure Laterality Date  . APPENDECTOMY    . EYE SURGERY     laser surgery for glaucoma-closed angle glaucoma  . laporoscopy N/A   . ROBOTIC ASSISTED TOTAL HYSTERECTOMY WITH BILATERAL SALPINGO OOPHERECTOMY N/A 09/28/2019   Procedure: XI ROBOTIC ASSISTED TOTAL HYSTERECTOMY FOR UTERUS > THEN 250G WITH BILATERAL SALPINGO OOPHORECTOMY;  Surgeon: Everitt Amber, MD;  Location: [facility]  ORS;  Service: Gynecology;  Laterality: N/A;  . SENTINEL NODE BIOPSY N/A 09/28/2019   Procedure: SENTINEL LYMPH NODE BIOPSY;  Surgeon: Everitt Amber, MD;  Location: [facility] ORS;  Service: Gynecology;  Laterality: N/A;  . TONSILLECTOMY Bilateral   . TUBAL LIGATION      SOCIAL HISTORY: Social History   Socioeconomic History  . Marital status: Married    Spouse name: [name]  . Number of children: 4  . Years of education: Not on file  . Highest education level: Not on file  Occupational History  . Occupation: Oceanographer  Tobacco Use  . Smoking status: Former Smoker    Packs/day: 0.50    Years: 3.00    Pack years: 1.50    Types: Cigarettes    Quit date: 1983    Years since quitting: 39.0  . Smokeless tobacco: Never Used  Vaping Use  . Vaping Use: Never used  Substance and Sexual Activity  . Alcohol use: Not Currently  . Drug use: Never  . Sexual activity: Not Currently    Birth control/protection: Other-see comments    Comment: tubal ligation  Other Topics Concern  . Not on file  Social History Narrative  . Not on file   Social Determinants of Health   Financial Resource Strain: Not on file  Food Insecurity: Not on file  Transportation Needs: Not on file  Physical Activity: Not on file  Stress: Not on file  Social Connections: Not on file  Intimate Partner Violence: Not on file    FAMILY HISTORY: Family History  Problem Relation Age of Onset  . Leukemia Father   . Diabetes Father   . Diabetes Maternal Grandmother   . Kidney cancer Maternal Grandmother   . Colon cancer Cousin   . Ovarian cancer Neg Hx   . Breast cancer Neg Hx   . Prostate cancer Neg Hx   . Endometrial cancer Neg Hx   . Pancreatic cancer Neg Hx     ALLERGIES:  is allergic to other and sulfacetamide sodium-sulfur.  MEDICATIONS:  Current Outpatient Medications  Medication Sig Dispense Refill  . dexamethasone (DECADRON) 4 MG tablet Take 2 tabs at the night before and 2 tab the  morning of chemotherapy, every 3 weeks, by mouth x 6 cycles 36 tablet 6  . oxyCODONE (ROXICODONE) 15 MG immediate release tablet Take 1 tablet (15 mg total) by mouth every 4 (four) hours as needed for severe pain. 60 tablet 0  . docusate sodium (COLACE) 100 MG capsule Take 100 mg by mouth daily.    . Lidocaine-Menthol (ICY HOT LIDOCAINE PLUS MENTHOL EX) Apply 1 application topically daily as needed (pain).    . montelukast (SINGULAIR) 10 MG tablet Take 10 mg by mouth daily.    . ondansetron (ZOFRAN ODT) 4 MG disintegrating tablet Take 1 tablet (4 mg total) by mouth every 8 (eight) hours as needed for nausea or vomiting. (Patient not taking: No sig reported) 20 tablet 0  . PROAIR RESPICLICK 098 (90 Base) MCG/ACT AEPB Inhale 1 puff into the lungs every 4 (four) hours as needed (wheezing/shortness of breath).     . promethazine (PHENERGAN) 25 MG tablet Take 1 tablet (25 mg total) by mouth every 6 (six) hours as needed for nausea or vomiting. 60 tablet 1   Current Facility-Administered Medications  Medication Dose Route Frequency Provider Last Rate Last Admin  . oxyCODONE (Oxy IR/ROXICODONE) immediate release tablet 10 mg  10 mg Oral Q4H PRN Heath Lark, MD   10 mg at 10/04/20 1436    REVIEW OF SYSTEMS:   Constitutional: Denies fevers, chills or abnormal night sweats Eyes: Denies blurriness of vision, double vision or watery eyes Ears, nose, mouth, throat, and face: Denies mucositis or sore throat Respiratory: Denies cough, dyspnea or wheezes Cardiovascular: Denies palpitation, chest discomfort or lower extremity swelling Skin: Denies abnormal skin rashes Lymphatics: Denies new lymphadenopathy or easy bruising Behavioral/Psych: Mood is stable, no new changes  All other systems were reviewed with the patient and are negative.  PHYSICAL EXAMINATION: ECOG PERFORMANCE STATUS: 2 - Symptomatic, <50% confined to bed  Vitals:   10/04/20 1334  BP: 113/65  Pulse: (!) 103  Resp: 18  Temp: 97.6 F  (36.4 C)  SpO2: 97%   Filed Weights   10/04/20 1334  Weight: 241 lb 9.6 oz (109.6 kg)    GENERAL:alert, no distress and comfortable.  She appears somewhat jittery SKIN: skin color, texture, turgor are normal, no rashes or significant lesions EYES: normal, conjunctiva are pink and non-injected, sclera clear OROPHARYNX:no exudate, no erythema and lips, buccal mucosa, and tongue normal  NECK: supple, thyroid normal size, non-tender, without nodularity LYMPH:  no palpable lymphadenopathy in the cervical, axillary or inguinal LUNGS: clear to auscultation and percussion with normal breathing effort HEART: regular rate & rhythm and no murmurs and no lower extremity edema ABDOMEN:abdomen soft, distended, probable carcinomatosis, normal bowel sounds Musculoskeletal:no cyanosis of digits and no clubbing  PSYCH: alert & oriented x 3 with fluent speech NEURO: no focal motor/sensory deficits  LABORATORY DATA:  I have reviewed the data as listed Lab Results  Component Value Date   WBC 7.6 10/04/2020   HGB 13.4 10/04/2020   HCT 41.6 10/04/2020   MCV 88.5 10/04/2020   PLT 435 (H) 10/04/2020   Recent Labs    09/20/20 1432 10/04/20 1315  NA 137 142  K 5.3* 4.0  CL 99 106  CO2 26 21*  GLUCOSE 145* 127*  BUN 16 21  CREATININE 0.85 1.04*  CALCIUM 9.6 9.5  GFRNONAA >60 >60  PROT 8.2* 8.2*  ALBUMIN 3.4* 2.8*  AST 34 56*  ALT 28 14  ALKPHOS 263* 134*  BILITOT 0.5 0.3    RADIOGRAPHIC STUDIES: I have personally reviewed the radiological images as listed and agreed with the findings in the report. CT ABDOMEN PELVIS W CONTRAST  Result Date: 09/21/2020 CLINICAL DATA:  Abdominal distension for 1 month, history of endometrial cancer EXAM: CT ABDOMEN AND PELVIS WITH CONTRAST TECHNIQUE: Multidetector CT imaging of the abdomen and pelvis was performed using the standard protocol following bolus administration of intravenous contrast. CONTRAST:  180m OMNIPAQUE IOHEXOL 300 MG/ML  SOLN  COMPARISON:  07/30/2019 FINDINGS: Lower chest: No acute pleural or parenchymal lung disease. Hepatobiliary: Numerous serosal implants are seen along the liver, consistent with metastatic disease in a patient with a history of endometrial cancer. Largest of these inferior right lobe liver measuring 3.3 x 2.9 cm. There are no focal liver parenchymal abnormalities. The gallbladder is unremarkable. Pancreas: Unremarkable. No pancreatic ductal dilatation or surrounding inflammatory changes. Spleen: Normal in size without focal abnormality. Adrenals/Urinary Tract: Adrenal glands are unremarkable. Kidneys are normal, without renal calculi, focal lesion, or hydronephrosis. Bladder is unremarkable. Stomach/Bowel: No bowel obstruction or ileus. The appendix is surgically absent. No bowel wall thickening or inflammatory change. Vascular/Lymphatic: Minimal atherosclerosis of the aorta. No discrete adenopathy. Reproductive: Uterus and adnexal structures are surgically absent. Other: Soft tissue masses are seen throughout the mesentery, consistent with metastatic disease and peritoneal carcinomatosis. Omental caking in the ventral abdomen measures up to 5.1 cm in thickness. Soft tissue nodules are seen within the lower pelvis as well. There is free fluid throughout the abdomen and pelvis. No free intraperitoneal gas. No abdominal wall hernia. Musculoskeletal: No acute or destructive bony lesions. 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 Reconstructed images demonstrate no additional findings. IMPRESSION: 1. Findings of diffuse peritoneal carcinomatosis, with serosal implants along the liver, omental caking, and multiple mesenteric soft tissue nodules as above. Findings are compatible with metastatic endometrial cancer. 2. Abdominal and pelvic ascites. 3.  Aortic Atherosclerosis (ICD10-I70.0). Electronically Signed   By: Randa Ngo M.D.   On: 09/21/2020 02:28

## 2020-10-04 NOTE — Assessment & Plan Note (Signed)
She has poorly controlled pain I plan to increase her pain medicine I will discontinue Percocet and switch her to oxycodone only She can continue to take acetaminophen as needed

## 2020-10-04 NOTE — Assessment & Plan Note (Signed)
She is very ill with significant abdominal symptoms of carcinomatosis Her case will be presented at the next GYN oncology tumor board due to significant burden of disease seen so soon after surgery She is scheduled for biopsy tomorrow to confirm the disease  We reviewed the NCCN guidelines We discussed the role of chemotherapy. The intent is of palliative intent, if biopsy confirms similar disease of recurrence I do not feel strongly we need to wait for confirmation from biopsy The biopsy could be helpful for future use to guide prognosis and treatment  We discussed some of the risks, benefits, side-effects of carboplatin & Taxol. Treatment is intravenous, every 3 weeks x 6 cycles  Some of the short term side-effects included, though not limited to, including weight loss, life threatening infections, risk of allergic reactions, need for transfusions of blood products, nausea, vomiting, change in bowel habits, loss of hair, admission to hospital for various reasons, and risks of death.   Long term side-effects are also discussed including risks of infertility, permanent damage to nerve function, hearing loss, chronic fatigue, kidney damage with possibility needing hemodialysis, and rare secondary malignancy including bone marrow disorders.  The patient is aware that the response rates discussed earlier is not guaranteed.  After a long discussion, patient made an informed decision to proceed with the prescribed plan of care.   Patient education material was dispensed. We discussed premedication with dexamethasone before chemotherapy. Due to poor venous access, I recommend PICC line placement before surgery I plan to see her back Friday morning before the start date of treatment for supportive care

## 2020-10-04 NOTE — Assessment & Plan Note (Signed)
If biopsy confirmed recurrent cancer, treatment goal is strictly palliative in nature She is aware

## 2020-10-04 NOTE — Progress Notes (Signed)
   Covid-19 Vaccination Clinic  Name:  Sandra Hunt    MRN: [id number] DOB: 1955/10/05  10/04/2020  [name] was observed post Covid-19 immunization for 15 minutes without incident. She was provided with Vaccine Information Sheet and instruction to access the V-Safe system.   [name] was instructed to call 911 with any severe reactions post vaccine: Marland Kitchen Difficulty breathing  . Swelling of face and throat  . A fast heartbeat  . A bad rash all over body  . Dizziness and weakness   Immunizations Administered    Name Date Dose VIS Date Route   PFIZER Comrnaty(Gray TOP) Covid-19 Vaccine 10/04/2020  3:06 PM 0.3 mL 08/25/2020 Intramuscular   Manufacturer: [employer]   Lot: [id number]   NDC: [phone]

## 2020-10-04 NOTE — Telephone Encounter (Signed)
Called and left a message asking for Pam to call the office back. Dr. Alvy Bimler sent a referral for home health for IV fluids and nursing care.

## 2020-10-04 NOTE — Assessment & Plan Note (Signed)
She has class II obesity The calculated dose of chemotherapy is very high Plan to reduce paclitaxel to 140 mg per metered squared and carboplatin to AUC of 5

## 2020-10-04 NOTE — Assessment & Plan Note (Addendum)
This is due to carcinomatosis I refill her prescription of antiemetics She is at risk of dehydration I recommend IV fluids at home We will consult advanced home care for this

## 2020-10-05 ENCOUNTER — Other Ambulatory Visit: Payer: Self-pay | Admitting: Hematology and Oncology

## 2020-10-05 ENCOUNTER — Other Ambulatory Visit: Payer: Self-pay

## 2020-10-05 ENCOUNTER — Telehealth: Payer: Self-pay

## 2020-10-05 ENCOUNTER — Encounter (HOSPITAL_COMMUNITY): Payer: Self-pay

## 2020-10-05 ENCOUNTER — Ambulatory Visit (HOSPITAL_COMMUNITY)
Admission: RE | Admit: 2020-10-05 | Discharge: 2020-10-05 | Disposition: A | Discharge status: Home / Self Care | Payer: Federal, State, Local not specified - PPO | Source: Ambulatory Visit | Attending: Hematology and Oncology | Admitting: Hematology and Oncology

## 2020-10-05 ENCOUNTER — Ambulatory Visit (HOSPITAL_COMMUNITY)
Admission: RE | Admit: 2020-10-05 | Discharge: 2020-10-05 | Disposition: A | Discharge status: Home / Self Care | Payer: Federal, State, Local not specified - PPO | Source: Ambulatory Visit | Attending: Gynecologic Oncology | Admitting: Gynecologic Oncology

## 2020-10-05 DIAGNOSIS — C541 Malignant neoplasm of endometrium: Secondary | ICD-10-CM

## 2020-10-05 DIAGNOSIS — C801 Malignant (primary) neoplasm, unspecified: Secondary | ICD-10-CM | POA: Diagnosis not present

## 2020-10-05 DIAGNOSIS — Z8542 Personal history of malignant neoplasm of other parts of uterus: Secondary | ICD-10-CM | POA: Diagnosis not present

## 2020-10-05 DIAGNOSIS — C786 Secondary malignant neoplasm of retroperitoneum and peritoneum: Secondary | ICD-10-CM

## 2020-10-05 DIAGNOSIS — Z87891 Personal history of nicotine dependence: Secondary | ICD-10-CM | POA: Insufficient documentation

## 2020-10-05 DIAGNOSIS — Z806 Family history of leukemia: Secondary | ICD-10-CM | POA: Insufficient documentation

## 2020-10-05 DIAGNOSIS — R1909 Other intra-abdominal and pelvic swelling, mass and lump: Secondary | ICD-10-CM | POA: Diagnosis not present

## 2020-10-05 DIAGNOSIS — Z01812 Encounter for preprocedural laboratory examination: Secondary | ICD-10-CM | POA: Insufficient documentation

## 2020-10-05 DIAGNOSIS — Z452 Encounter for adjustment and management of vascular access device: Secondary | ICD-10-CM | POA: Diagnosis not present

## 2020-10-05 DIAGNOSIS — C481 Malignant neoplasm of specified parts of peritoneum: Secondary | ICD-10-CM | POA: Diagnosis not present

## 2020-10-05 DIAGNOSIS — R180 Malignant ascites: Secondary | ICD-10-CM | POA: Diagnosis not present

## 2020-10-05 DIAGNOSIS — I700 Atherosclerosis of aorta: Secondary | ICD-10-CM | POA: Insufficient documentation

## 2020-10-05 DIAGNOSIS — R188 Other ascites: Secondary | ICD-10-CM | POA: Insufficient documentation

## 2020-10-05 DIAGNOSIS — C482 Malignant neoplasm of peritoneum, unspecified: Secondary | ICD-10-CM | POA: Diagnosis not present

## 2020-10-05 HISTORY — PX: IR PARACENTESIS: IMG2679

## 2020-10-05 LAB — CA 125: Cancer Antigen (CA) 125: 917 U/mL — ABNORMAL HIGH (ref 0.0–38.1)

## 2020-10-05 MED ORDER — FENTANYL CITRATE (PF) 100 MCG/2ML IJ SOLN
INTRAMUSCULAR | Status: AC
  Filled 2020-10-05: qty 2

## 2020-10-05 MED ORDER — LIDOCAINE-EPINEPHRINE 1 %-1:100000 IJ SOLN
INTRAMUSCULAR | Status: AC
  Filled 2020-10-05: qty 1

## 2020-10-05 MED ORDER — LIDOCAINE HCL 1 % IJ SOLN
INTRAMUSCULAR | Status: AC | PRN
  Administered 2020-10-05: 20 mL via INTRADERMAL

## 2020-10-05 MED ORDER — SODIUM CHLORIDE 0.9 % IV SOLN: INTRAVENOUS | Status: DC

## 2020-10-05 MED ORDER — MIDAZOLAM HCL 2 MG/2ML IJ SOLN
INTRAMUSCULAR | Status: AC
  Filled 2020-10-05: qty 2

## 2020-10-05 MED ORDER — ONDANSETRON HCL 4 MG/2ML IJ SOLN
INTRAMUSCULAR | Status: AC
  Filled 2020-10-05: qty 2

## 2020-10-05 MED ORDER — MIDAZOLAM HCL 2 MG/2ML IJ SOLN
INTRAMUSCULAR | Status: AC | PRN
  Administered 2020-10-05: 0.5 mg via INTRAVENOUS
  Administered 2020-10-05: 1 mg via INTRAVENOUS

## 2020-10-05 MED ORDER — LIDOCAINE HCL 1 % IJ SOLN
INTRAMUSCULAR | Status: AC
  Filled 2020-10-05: qty 20

## 2020-10-05 MED ORDER — FENTANYL CITRATE (PF) 100 MCG/2ML IJ SOLN
INTRAMUSCULAR | Status: AC | PRN
  Administered 2020-10-05: 25 ug via INTRAVENOUS

## 2020-10-05 MED ORDER — GELATIN ABSORBABLE 12-7 MM EX MISC
CUTANEOUS | Status: AC
  Filled 2020-10-05: qty 1

## 2020-10-05 NOTE — Telephone Encounter (Addendum)
VM message left by Carolynn Sayers stating that Pt. May have to come in for fluids. Pam stated that she is not hopeful to find Nursing with [employer]. Pam stated she would call agencies to see if she could find a nurse.TC to Pam @[phone]  to follow up on search for nursing left message for Pam to return call. Return call from [facility] at 1630 stating she still hasn't found a nurse yet but is waiting for another call. Will call back tomorrow.

## 2020-10-05 NOTE — Procedures (Signed)
Pre procedural Dx: Omental mass; Symptomatic ascites  Post procedural Dx: Same  Technically successful CT guided biopsy of ventral omental mass Technically successful image guided paracentesis yielding 2.6 L of serous fluid.   EBL: None.  Complications: None immediate.   Ronny Bacon, MD Pager #: [phone]

## 2020-10-05 NOTE — H&P (Signed)
Chief Complaint: Patient was seen in consultation today for image-guided omental mass biopsy   Referring Physician(s): Adolphus Birchwoodossi, Emma  Supervising Physician: Simonne ComeWatts, John  Patient Status: Sandra Hunt  History of Present Illness: Sandra Hunt is a 65 y.o. female with a medical history significant for stage IA endometrial cancer. Prior to this diagnosis, she reported having irregular menstrual bleeding for several years. A transvaginal ultrasound revealed endometrial thickening and a transvaginal biopsy was performed, with pathology positive for endometrioid adenocarcinoma. On 09/28/19 she underwent a robotic-assisted total hysterectomy with BSO. There was no cervical or nodal involvement and in accordance with guidelines the patient did not receive any additional adjuvant therapy.  The patient presented to the ED 09/20/20 with worsening abdominal pain and distention with intermittent vomiting and decreased appetite. Imaging was obtained.  CT abdomen/Pelvis w/contrast 09/21/20 IMPRESSION: 1. Findings of diffuse peritoneal carcinomatosis, with serosal implants along the liver, omental caking, and multiple mesenteric soft tissue nodules as above. Findings are compatible with metastatic endometrial cancer. 2. Abdominal and pelvic ascites. 3.  Aortic Atherosclerosis (ICD10-I70.0).  Interventional Radiology has been asked to evaluate this patient for an image-guided omental mass biopsy for . This case has been reviewed and procedure approved by Dr. Deanne CofferHassell.     Past Medical History:  Diagnosis Date  . Allergy   . Asthma    allergic  . Cancer [facility](HCC)    endometrial cancer   . Depression   . Family history of kidney cancer   . Family history of leukemia   . Glaucoma   . Headache    migraines-but none in many years  . Hearing loss   . Hypertension   . PONV (postoperative nausea and vomiting)   . Pre-diabetes     Past Surgical History:  Procedure Laterality Date  .  APPENDECTOMY    . EYE SURGERY     laser surgery for glaucoma-closed angle glaucoma  . laporoscopy N/A   . ROBOTIC ASSISTED TOTAL HYSTERECTOMY WITH BILATERAL SALPINGO OOPHERECTOMY N/A 09/28/2019   Procedure: XI ROBOTIC ASSISTED TOTAL HYSTERECTOMY FOR UTERUS > THEN 250G WITH BILATERAL SALPINGO OOPHORECTOMY;  Surgeon: Adolphus Birchwoodossi, Emma, MD;  Location: [facility];  Service: Gynecology;  Laterality: N/A;  . SENTINEL NODE BIOPSY N/A 09/28/2019   Procedure: SENTINEL LYMPH NODE BIOPSY;  Surgeon: Adolphus Birchwoodossi, Emma, MD;  Location: [facility];  Service: Gynecology;  Laterality: N/A;  . TONSILLECTOMY Bilateral   . TUBAL LIGATION      Allergies: Other and Sulfacetamide sodium-sulfur  Medications: Prior to Admission medications   Medication Sig Start Date End Date Taking? Authorizing Provider  docusate sodium (COLACE) 100 MG capsule Take 100 mg by mouth daily.   Yes [provider]  Lidocaine-Menthol (ICY HOT LIDOCAINE PLUS MENTHOL EX) Apply 1 application topically daily as needed (pain).   Yes [provider]  montelukast (SINGULAIR) 10 MG tablet Take 10 mg by mouth daily. 08/15/20  Yes [provider]  PROAIR RESPICLICK 108 (90 Base) MCG/ACT AEPB Inhale 1 puff into the lungs every 4 (four) hours as needed (wheezing/shortness of breath).  07/14/19  Yes [provider]  dexamethasone (DECADRON) 4 MG tablet Take 2 tabs at the night before and 2 tab the morning of chemotherapy, every 3 weeks, by mouth x 6 cycles 10/04/20   Artis DelayGorsuch, Ni, MD  ondansetron (ZOFRAN ODT) 4 MG disintegrating tablet Take 1 tablet (4 mg total) by mouth every 8 (eight) hours as needed for nausea or vomiting. Patient not taking: No sig reported 09/28/20  Everitt Amber, MD  oxyCODONE (ROXICODONE) 15 MG immediate release tablet Take 1 tablet (15 mg total) by mouth every 4 (four) hours as needed for severe pain. 10/04/20   Heath Lark, MD  promethazine (PHENERGAN) 25 MG tablet Take 1 tablet (25 mg total) by mouth every 6 (six)  hours as needed for nausea or vomiting. 10/04/20   Heath Lark, MD     Family History  Problem Relation Age of Onset  . Leukemia Father   . Diabetes Father   . Diabetes Maternal Grandmother   . Kidney cancer Maternal Grandmother   . Colon cancer Cousin   . Ovarian cancer Neg Hx   . Breast cancer Neg Hx   . Prostate cancer Neg Hx   . Endometrial cancer Neg Hx   . Pancreatic cancer Neg Hx     Social History   Socioeconomic History  . Marital status: Married    Spouse name: Sandra Hunt  . Number of children: 4  . Years of education: Not on file  . Highest education level: Not on file  Occupational History  . Occupation: Oceanographer  Tobacco Use  . Smoking status: Former Smoker    Packs/day: 0.50    Years: 3.00    Pack years: 1.50    Types: Cigarettes    Quit date: 1983    Years since quitting: 39.0  . Smokeless tobacco: Never Used  Vaping Use  . Vaping Use: Never used  Substance and Sexual Activity  . Alcohol use: Not Currently  . Drug use: Never  . Sexual activity: Not Currently    Birth control/protection: Other-see comments    Comment: tubal ligation  Other Topics Concern  . Not on file  Social History Narrative  . Not on file   Social Determinants of Health   Financial Resource Strain: Not on file  Food Insecurity: Not on file  Transportation Needs: Not on file  Physical Activity: Not on file  Stress: Not on file  Social Connections: Not on file    Review of Systems: A 12 point ROS discussed and pertinent positives are indicated in the HPI above.  All other systems are negative.  Review of Systems  Constitutional: Positive for appetite change and fatigue.  Respiratory: Negative for cough and shortness of breath.   Gastrointestinal: Positive for abdominal distention and nausea.    Vital Signs: Temp: 97.7; BP 127/85; HR 55, RR 18, O2 Satus 97% on room air   Physical Exam Constitutional:      General: She is not in acute distress.     Appearance: She is obese.  HENT:     Mouth/Throat:     Mouth: Mucous membranes are moist.     Pharynx: Oropharynx is clear.  Cardiovascular:     Rate and Rhythm: Normal rate and regular rhythm.     Pulses: Normal pulses.     Heart sounds: Normal heart sounds.  Pulmonary:     Effort: Pulmonary effort is normal.     Breath sounds: Normal breath sounds.  Abdominal:     General: There is distension.     Palpations: Abdomen is soft.  Skin:    General: Skin is warm and dry.  Neurological:     Mental Status: She is alert and oriented to person, place, and time.     Imaging: CT ABDOMEN PELVIS W CONTRAST  Result Date: 09/21/2020 CLINICAL DATA:  Abdominal distension for 1 month, history of endometrial cancer EXAM: CT ABDOMEN AND PELVIS WITH CONTRAST  TECHNIQUE: Multidetector CT imaging of the abdomen and pelvis was performed using the standard protocol following bolus administration of intravenous contrast. CONTRAST:  168mL OMNIPAQUE IOHEXOL 300 MG/ML  SOLN COMPARISON:  07/30/2019 FINDINGS: Lower chest: No acute pleural or parenchymal lung disease. Hepatobiliary: Numerous serosal implants are seen along the liver, consistent with metastatic disease in a patient with a history of endometrial cancer. Largest of these inferior right lobe liver measuring 3.3 x 2.9 cm. There are no focal liver parenchymal abnormalities. The gallbladder is unremarkable. Pancreas: Unremarkable. No pancreatic ductal dilatation or surrounding inflammatory changes. Spleen: Normal in size without focal abnormality. Adrenals/Urinary Tract: Adrenal glands are unremarkable. Kidneys are normal, without renal calculi, focal lesion, or hydronephrosis. Bladder is unremarkable. Stomach/Bowel: No bowel obstruction or ileus. The appendix is surgically absent. No bowel wall thickening or inflammatory change. Vascular/Lymphatic: Minimal atherosclerosis of the aorta. No discrete adenopathy. Reproductive: Uterus and adnexal structures are  surgically absent. Other: Soft tissue masses are seen throughout the mesentery, consistent with metastatic disease and peritoneal carcinomatosis. Omental caking in the ventral abdomen measures up to 5.1 cm in thickness. Soft tissue nodules are seen within the lower pelvis as well. There is free fluid throughout the abdomen and pelvis. No free intraperitoneal gas. No abdominal wall hernia. Musculoskeletal: No acute or destructive bony lesions. 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 Reconstructed images demonstrate no additional findings. IMPRESSION: 1. Findings of diffuse peritoneal carcinomatosis, with serosal implants along the liver, omental caking, and multiple mesenteric soft tissue nodules as above. Findings are compatible with metastatic endometrial cancer. 2. Abdominal and pelvic ascites. 3.  Aortic Atherosclerosis (ICD10-I70.0). Electronically Signed   By: Randa Ngo M.D.   On: 09/21/2020 02:28    Labs:  CBC: Recent Labs    09/20/20 1432 10/04/20 1315  WBC 10.5 7.6  HGB 13.4 13.4  HCT 43.4 41.6  PLT 553* 435*    COAGS: No results for input(s): INR, APTT in the last 8760 hours.  BMP: Recent Labs    09/20/20 1432 10/04/20 1315  NA 137 142  K 5.3* 4.0  CL 99 106  CO2 26 21*  GLUCOSE 145* 127*  BUN 16 21  CALCIUM 9.6 9.5  CREATININE 0.85 1.04*  GFRNONAA >60 >60    LIVER FUNCTION TESTS: Recent Labs    09/20/20 1432 10/04/20 1315  BILITOT 0.5 0.3  AST 34 56*  ALT 28 14  ALKPHOS 263* 134*  PROT 8.2* 8.2*  ALBUMIN 3.4* 2.8*    TUMOR MARKERS: No results for input(s): AFPTM, CEA, CA199, CHROMGRNA in the last 8760 hours.  Assessment and Plan:  History of endometrial cancer; new peritoneal carcinomatosis;  Sandra Hunt, 65 year old female, presents today to the [facility] department for an image-guided omental mass biopsy. She also presents today for the placement of a peripherally inserted central  catheter for chemotherapy and a paracentesis for ascites.   Risks and benefits of these procedures were discussed with the patient and/or patient's family including, but not limited to bleeding, infection, damage to adjacent structures or low yield requiring additional tests.  All of the questions were answered and there is agreement to proceed.  She has been NPO. Labs and vitals have been reviewed.   Consents signed and in chart.  Thank you for this interesting consult.  I greatly enjoyed meeting Sandra Hunt and look forward to participating in their care.  A copy of this report was sent to the requesting provider on this  date.  Electronically Signed: Soyla Dryer, AGACNP-BC [phone] 10/05/2020, 9:51 AM   I spent a total of  30 Minutes   in face to face in clinical consultation, greater than 50% of which was counseling/coordinating care for image-guided omental mass biopsy; PICC placement; paracentesis.

## 2020-10-05 NOTE — Procedures (Signed)
PROCEDURE SUMMARY:  Successful placement of image-guided single lumen PICC line to the right basilic vein. Length 39 cm. Tip at lower SVC/RA. No complications. EBL = <2. Ready for use.  Please see imaging section of Epic for full dictation.   Theresa Duty, NP 10/05/2020 10:05 AM

## 2020-10-05 NOTE — Discharge Instructions (Addendum)
PICC Insertion  A peripherally inserted central catheter (PICC) is a form of IV access that allows medicines and IV fluids to be quickly distributed throughout the body. The PICC is a thin, flexible tube (catheter) that is inserted into a vein in your arm or leg. The PICC is guided through your veins until the tip sits in a large vein just outside your heart (superior vena cava or SVC). After the PICC is inserted, a chest X-ray may be done to make sure that it is in the correct place. Only a health care provider trained in PICC insertion will do the procedure. You may have a PICC placed:  To give medicines and nutrition.  To rapidly give fluids or blood products.  To take frequent blood samples.  If there is a problem placing a peripheral intravenous (PIV) catheter. Tell a health care provider about:  Any allergies you have.  All medicines you are taking, including vitamins, herbs, eye drops, creams, and over-the-counter medicines.  Any problems you or family members have had with anesthetic medicines.  Any blood disorders you have.  Any surgeries you have had.  Any medical conditions you have.  Whether you are pregnant or may be pregnant. What are the risks? Generally, this is a safe procedure. However, problems may occur, including:  Bleeding. This may happen at the insertion site or internally.  Infection. This may occur at the insertion site or in the blood.  Movement or malposition of the PICC.  Inflammation of the vein (phlebitis).  Nerve injury or irritation.  Clot formation at the tip of the PICC line.  Blood clot in the lung (pulmonary embolus).  Injury or collapse of the lung (pneumothorax).  Injury to the large blood vessels or the heart. This is rare. What happens before the procedure?  Ask your health care provider about: ? Changing or stopping your regular medicines. This is especially important if you are taking diabetes medicines or blood  thinners. ? Taking over-the-counter medicines, vitamins, herbs, and supplements. ? Taking medicines such as aspirin and ibuprofen. These medicines can thin your blood. Do not take these medicines unless your health care provider tells you to take them.  You may have blood tests. These tests can help tell how well your blood clots.  Plan to have someone take you home from the hospital or clinic.  Plan to have a responsible adult care for you for at least 24 hours after you leave the hospital or clinic. This is important.  Follow instructions from your health care provider about eating or drinking restrictions. What happens during the procedure?  To reduce your risk of infection: ? Your health care team will wash or sanitize their hands. ? Your skin will be washed with soap. ? Hair may be removed from the surgical area.  Your health care provider will identify a vein into which the PICC line will be inserted. This may be done using ultrasound or X-ray guidance (fluoroscopy).  You will be given one or more of the following: ? A medicine to help you relax (sedative). ? A medicine to numb the area (local anesthetic).  A tourniquet will be placed on your arm.  A small needle will be inserted into the vein and then a small guidewire will be advanced into the SVC.  The catheter will be advanced over the guidewire and moved into position. The guidewire will be removed.  The catheter will be flushed and blood will be drawn back to make sure it   is in the vein.  If this was done without X-ray guidance, an X-ray will be needed to make sure that the catheter tip is in the correct position.  Once the placement is confirmed, the PICC will be secured to the skin. This may be done with a device, such as tape, or sutures (stitches).  A bandage (dressing) will be placed over the PICC insertion site. The procedure may vary among health care providers and hospitals. What happens after the  procedure?  You will be told how to care for your PICC line.  Do not drive for 24 hours if you were given a sedative.  Your blood pressure, heart rate, breathing rate, and blood oxygen level will be monitored until the medicines you were given have worn off. Summary  A peripherally inserted central catheter (PICC) is a form of IV access that allows medicines and IV fluids to be quickly distributed throughout the body.  The PICC is a long, thin, flexible tube that is inserted into a vein in your arm or leg and then guided to a large vein (SVC) in your chest.  You will be instructed on how to care for your PICC line. This information is not intended to replace advice given to you by your health care provider. Make sure you discuss any questions you have with your health care provider. Document Revised: 01/13/2020 Document Reviewed: 01/13/2020 Elsevier Patient Education  2021 [place]. Needle Biopsy, Care After These instructions tell you how to care for yourself after your procedure. Your doctor may also give you more specific instructions. Call your doctor if you have any problems or questions. What can I expect after the procedure? After the procedure, it is common to have:  Soreness.  Bruising.  Mild pain. Follow these instructions at home:  Return to your normal activities as told by your doctor. Ask your doctor what activities are safe for you.  Take over-the-counter and prescription medicines only as told by your doctor.  Wash your hands with soap and water before you change your bandage (dressing). If you cannot use soap and water, use hand sanitizer.  Follow instructions from your doctor about: ? How to take care of your puncture site. ? When and how to change your bandage. ? When to remove your bandage.  Check your puncture site every day for signs of infection. Watch for: ? Redness, swelling, or pain. ? Fluid or blood. ? Pus or a bad smell. ? Warmth.  Do not  take baths, swim, or use a hot tub until your doctor approves. Ask your doctor if you may take showers. You may only be allowed to take sponge baths.  Keep all follow-up visits as told by your doctor. This is important.   Contact a doctor if you have:  A fever.  Redness, swelling, or pain at the puncture site, and it lasts longer than a few days.  Fluid, blood, or pus coming from the puncture site.  Warmth coming from the puncture site. Get help right away if:  You have a lot of bleeding from the puncture site. Summary  After the procedure, it is common to have soreness, bruising, or mild pain at the puncture site.  Check your puncture site every day for signs of infection, such as redness, swelling, or pain.  Get help right away if you have severe bleeding from your puncture site. This information is not intended to replace advice given to you by your health care provider. Make sure you  discuss any questions you have with your health care provider. Document Revised: 03/03/2020 Document Reviewed: 03/03/2020 Elsevier Patient Education  2021 [place]. Moderate Conscious Sedation, Adult Sedation is the use of medicines to promote relaxation and to relieve discomfort and anxiety. Moderate conscious sedation is a type of sedation. Under moderate conscious sedation, you are less alert than normal, but you are still able to respond to instructions, touch, or both. Moderate conscious sedation is used during short medical and dental procedures. It is milder than deep sedation, which is a type of sedation under which you cannot be easily woken up. It is also milder than general anesthesia, which is the use of medicines to make you unconscious. Moderate conscious sedation allows you to return to your regular activities sooner. Tell a health care provider about:  Any allergies you have.  All medicines you are taking, including vitamins, herbs, eye drops, creams, and over-the-counter  medicines.  Any use of steroids. This includes steroids taken by mouth or as a cream.  Any problems you or family members have had with sedatives and anesthetic medicines.  Any blood disorders you have.  Any surgeries you have had.  Any medical conditions you have, such as sleep apnea.  Whether you are pregnant or may be pregnant.  Any use of cigarettes, alcohol, marijuana, or drugs. What are the risks? Generally, this is a safe procedure. However, problems may occur, including:  Getting too much medicine (oversedation).  Nausea.  Allergic reaction to medicines.  Trouble breathing. If this happens, a breathing tube may be used. It will be removed when you are awake and breathing on your own.  Heart trouble.  Lung trouble.  Confusion that gets better with time (emergence delirium). What happens before the procedure? Staying hydrated Follow instructions from your health care provider about hydration, which may include:  Up to 2 hours before the procedure - you may continue to drink clear liquids, such as water, clear fruit juice, black coffee, and plain tea. Eating and drinking restrictions Follow instructions from your health care provider about eating and drinking, which may include:  8 hours before the procedure - stop eating heavy meals or foods, such as meat, fried foods, or fatty foods.  6 hours before the procedure - stop eating light meals or foods, such as toast or cereal.  6 hours before the procedure - stop drinking milk or drinks that contain milk.  2 hours before the procedure - stop drinking clear liquids. Medicines Ask your health care provider about:  Changing or stopping your regular medicines. This is especially important if you are taking diabetes medicines or blood thinners.  Taking medicines such as aspirin and ibuprofen. These medicines can thin your blood. Do not take these medicines unless your health care provider tells you to take  them.  Taking over-the-counter medicines, vitamins, herbs, and supplements. Tests and exams  You will have a physical exam.  You may have blood tests done to show how well: ? Your kidneys and liver work. ? Your blood clots. General instructions  Plan to have a responsible adult take you home from the hospital or clinic.  If you will be going home right after the procedure, plan to have a responsible adult care for you for the time you are told. This is important. What happens during the procedure?  You will be given the sedative. The sedative may be given: ? As a pill that you will swallow. It can also be inserted into the rectum. ?  As a spray through the nose. ? As an injection into the muscle. ? As an injection into the vein through an IV.  You may be given oxygen as needed.  Your breathing, heart rate, and blood pressure will be monitored during the procedure.  The medical or dental procedure will be done. The procedure may vary among health care providers and hospitals.   What happens after the procedure?  Your blood pressure, heart rate, breathing rate, and blood oxygen level will be monitored until you leave the hospital or clinic.  You will get fluids through your IV if needed.  Do not drive or operate machinery until your health care provider says that it is safe. Summary  Sedation is the use of medicines to promote relaxation and to relieve discomfort and anxiety. Moderate conscious sedation is a type of sedation that is used during short medical and dental procedures.  Tell the health care provider about any medical conditions that you have and about all the medicines that you are taking.  You will be given the sedative as a pill, a spray through the nose, an injection into the muscle, or an injection into the vein through an IV. Vital signs are monitored during the sedation.  Moderate conscious sedation allows you to return to your regular activities  sooner. This information is not intended to replace advice given to you by your health care provider. Make sure you discuss any questions you have with your health care provider. Document Revised: 01/01/2020 Document Reviewed: 07/30/2019 Elsevier Patient Education  2021 [employer].

## 2020-10-06 ENCOUNTER — Encounter: Payer: Self-pay | Admitting: Hematology and Oncology

## 2020-10-06 ENCOUNTER — Telehealth: Payer: Self-pay

## 2020-10-06 NOTE — Progress Notes (Signed)
Called pt to introduce myself as her Arboriculturist.  Unfortunately there aren't any foundations offering copay assistance for her Dx.  I offered the [employer] and went over what it covers.  Pt doesn't know if she would like to apply right now so I told her I would send a request to the registration staff to give her my card in case she would like to apply in the future.

## 2020-10-06 NOTE — Telephone Encounter (Signed)
Sandra Hunt with [facility] called and left a message. They are unable to do IV fluids and nursing care.

## 2020-10-07 ENCOUNTER — Other Ambulatory Visit (HOSPITAL_COMMUNITY): Payer: Self-pay | Admitting: Hematology and Oncology

## 2020-10-07 ENCOUNTER — Telehealth: Payer: Self-pay

## 2020-10-07 ENCOUNTER — Encounter: Payer: Self-pay | Admitting: Hematology and Oncology

## 2020-10-07 ENCOUNTER — Other Ambulatory Visit: Payer: Self-pay

## 2020-10-07 ENCOUNTER — Inpatient Hospital Stay: Payer: Federal, State, Local not specified - PPO | Admitting: Hematology and Oncology

## 2020-10-07 ENCOUNTER — Inpatient Hospital Stay: Payer: Federal, State, Local not specified - PPO

## 2020-10-07 VITALS — BP 135/82 | HR 82 | Temp 98.8°F | Resp 16

## 2020-10-07 VITALS — BP 138/83 | HR 103 | Temp 98.3°F | Resp 18 | Ht 70.0 in | Wt 237.6 lb

## 2020-10-07 DIAGNOSIS — C787 Secondary malignant neoplasm of liver and intrahepatic bile duct: Secondary | ICD-10-CM | POA: Diagnosis not present

## 2020-10-07 DIAGNOSIS — R110 Nausea: Secondary | ICD-10-CM | POA: Diagnosis not present

## 2020-10-07 DIAGNOSIS — Z7189 Other specified counseling: Secondary | ICD-10-CM

## 2020-10-07 DIAGNOSIS — Z5111 Encounter for antineoplastic chemotherapy: Secondary | ICD-10-CM | POA: Diagnosis not present

## 2020-10-07 DIAGNOSIS — C801 Malignant (primary) neoplasm, unspecified: Secondary | ICD-10-CM | POA: Diagnosis not present

## 2020-10-07 DIAGNOSIS — Z90722 Acquired absence of ovaries, bilateral: Secondary | ICD-10-CM | POA: Diagnosis not present

## 2020-10-07 DIAGNOSIS — C541 Malignant neoplasm of endometrium: Secondary | ICD-10-CM

## 2020-10-07 DIAGNOSIS — G893 Neoplasm related pain (acute) (chronic): Secondary | ICD-10-CM

## 2020-10-07 DIAGNOSIS — Z95828 Presence of other vascular implants and grafts: Secondary | ICD-10-CM | POA: Diagnosis not present

## 2020-10-07 DIAGNOSIS — R140 Abdominal distension (gaseous): Secondary | ICD-10-CM | POA: Diagnosis not present

## 2020-10-07 DIAGNOSIS — R6881 Early satiety: Secondary | ICD-10-CM | POA: Diagnosis not present

## 2020-10-07 DIAGNOSIS — C786 Secondary malignant neoplasm of retroperitoneum and peritoneum: Secondary | ICD-10-CM | POA: Diagnosis not present

## 2020-10-07 DIAGNOSIS — K5909 Other constipation: Secondary | ICD-10-CM | POA: Diagnosis not present

## 2020-10-07 DIAGNOSIS — R188 Other ascites: Secondary | ICD-10-CM | POA: Diagnosis not present

## 2020-10-07 DIAGNOSIS — R630 Anorexia: Secondary | ICD-10-CM | POA: Diagnosis not present

## 2020-10-07 DIAGNOSIS — Z8542 Personal history of malignant neoplasm of other parts of uterus: Secondary | ICD-10-CM | POA: Diagnosis not present

## 2020-10-07 DIAGNOSIS — Z90710 Acquired absence of both cervix and uterus: Secondary | ICD-10-CM | POA: Diagnosis not present

## 2020-10-07 LAB — SURGICAL PATHOLOGY

## 2020-10-07 LAB — CYTOLOGY - NON PAP

## 2020-10-07 MED ORDER — FAMOTIDINE IN NACL 20-0.9 MG/50ML-% IV SOLN
INTRAVENOUS | Status: AC
  Filled 2020-10-07: qty 50

## 2020-10-07 MED ORDER — ALTEPLASE 2 MG IJ SOLR
INTRAMUSCULAR | Status: AC
  Filled 2020-10-07: qty 2

## 2020-10-07 MED ORDER — SODIUM CHLORIDE 0.9 % IV SOLN
Freq: Once | INTRAVENOUS | Status: AC
  Filled 2020-10-07: qty 250

## 2020-10-07 MED ORDER — SODIUM CHLORIDE 0.9 % IV SOLN
140.0000 mg/m2 | Freq: Once | INTRAVENOUS | Status: AC
  Administered 2020-10-07: 324 mg via INTRAVENOUS
  Filled 2020-10-07: qty 54

## 2020-10-07 MED ORDER — DIPHENHYDRAMINE HCL 50 MG/ML IJ SOLN
INTRAMUSCULAR | Status: AC
  Filled 2020-10-07: qty 1

## 2020-10-07 MED ORDER — NORMAL SALINE FLUSH 0.9 % IV SOLN: 10.0000 mL | Freq: Every day | INTRAVENOUS | 11 refills | Status: DC

## 2020-10-07 MED ORDER — HEPARIN SOD (PORK) LOCK FLUSH 100 UNIT/ML IV SOLN
250.0000 [IU] | Freq: Once | INTRAVENOUS | Status: AC | PRN
  Administered 2020-10-07: 250 [IU]
  Filled 2020-10-07: qty 5

## 2020-10-07 MED ORDER — PALONOSETRON HCL INJECTION 0.25 MG/5ML
0.2500 mg | Freq: Once | INTRAVENOUS | Status: AC
  Administered 2020-10-07: 0.25 mg via INTRAVENOUS

## 2020-10-07 MED ORDER — SODIUM CHLORIDE 0.9 % IV SOLN
10.0000 mg | Freq: Once | INTRAVENOUS | Status: AC
  Administered 2020-10-07: 10 mg via INTRAVENOUS
  Filled 2020-10-07: qty 10

## 2020-10-07 MED ORDER — SODIUM CHLORIDE 0.9 % IV SOLN
610.0000 mg | Freq: Once | INTRAVENOUS | Status: AC
  Administered 2020-10-07: 610 mg via INTRAVENOUS
  Filled 2020-10-07: qty 61

## 2020-10-07 MED ORDER — PALONOSETRON HCL INJECTION 0.25 MG/5ML
INTRAVENOUS | Status: AC
  Filled 2020-10-07: qty 5

## 2020-10-07 MED ORDER — ALTEPLASE 2 MG IJ SOLR
2.0000 mg | Freq: Once | INTRAMUSCULAR | Status: AC
  Administered 2020-10-07: 2 mg
  Filled 2020-10-07: qty 2

## 2020-10-07 MED ORDER — DIPHENHYDRAMINE HCL 50 MG/ML IJ SOLN
25.0000 mg | Freq: Once | INTRAMUSCULAR | Status: AC
  Administered 2020-10-07: 25 mg via INTRAVENOUS

## 2020-10-07 MED ORDER — SODIUM CHLORIDE 0.9 % IV SOLN
150.0000 mg | Freq: Once | INTRAVENOUS | Status: AC
  Administered 2020-10-07: 150 mg via INTRAVENOUS
  Filled 2020-10-07: qty 150

## 2020-10-07 MED ORDER — FAMOTIDINE IN NACL 20-0.9 MG/50ML-% IV SOLN
20.0000 mg | Freq: Once | INTRAVENOUS | Status: AC
  Administered 2020-10-07: 20 mg via INTRAVENOUS

## 2020-10-07 MED ORDER — ALTEPLASE 2 MG IJ SOLR
2.0000 mg | Freq: Once | INTRAMUSCULAR | Status: AC | PRN
  Administered 2020-10-07: 2 mg
  Filled 2020-10-07: qty 2

## 2020-10-07 MED ORDER — SODIUM CHLORIDE 0.9% FLUSH
10.0000 mL | INTRAVENOUS | Status: DC | PRN
  Administered 2020-10-07: 10 mL
  Filled 2020-10-07: qty 10

## 2020-10-07 MED FILL — NORMAL SALINE FLUSH SYRINGE: 0.9 | 30 days supply | Qty: 300 | Fill #0

## 2020-10-07 NOTE — Assessment & Plan Note (Signed)
Clinically, she is feeling better Her pain is well controlled She is able to hydrate adequately For now, we will put the plan of care with advanced home care on hold given staff shortage I plan to see her again early next week for further follow-up and supportive care Biopsy from peritoneum is pending but I think we should proceed with combination carboplatin and Taxol without further delay She is in agreement to proceed

## 2020-10-07 NOTE — Assessment & Plan Note (Signed)
She will continue antiemetics

## 2020-10-07 NOTE — Telephone Encounter (Signed)
Called son and instructed to pick up saline flushes for PICC daily flush at [facility] outpatient pharmacy. He verbalized understanding and will pick up.

## 2020-10-07 NOTE — Progress Notes (Signed)
Per Dr. Alvy Bimler, proceed with premeds through PIV and check PICC for blood return for chemo.  Per Dr. Alvy Bimler, administer another dose of cathflo into PICC line.

## 2020-10-07 NOTE — Assessment & Plan Note (Signed)
I recommend PICC line care here weekly She will continue PICC line flushes at home

## 2020-10-07 NOTE — Assessment & Plan Note (Signed)
Her pain is well controlled We discussed narcotic refill policy 

## 2020-10-07 NOTE — Patient Instructions (Addendum)
[facility] Discharge Instructions for Patients Receiving Chemotherapy  Today you received the following chemotherapy agents: Paclitaxel and Carboplatin  To help prevent nausea and vomiting after your treatment, we encourage you to take your nausea medication as directed.   If you develop nausea and vomiting that is not controlled by your nausea medication, call the clinic.   BELOW ARE SYMPTOMS THAT SHOULD BE REPORTED IMMEDIATELY:  *FEVER GREATER THAN 100.5 F  *CHILLS WITH OR WITHOUT FEVER  NAUSEA AND VOMITING THAT IS NOT CONTROLLED WITH YOUR NAUSEA MEDICATION  *UNUSUAL SHORTNESS OF BREATH  *UNUSUAL BRUISING OR BLEEDING  TENDERNESS IN MOUTH AND THROAT WITH OR WITHOUT PRESENCE OF ULCERS  *URINARY PROBLEMS  *BOWEL PROBLEMS  UNUSUAL RASH Items with * indicate a potential emergency and should be followed up as soon as possible.  Feel free to call the clinic should you have any questions or concerns. The clinic phone number is [phone].  Please show the [facility] at check-in to the Emergency Department and triage nurse.  Paclitaxel injection What is this medicine? PACLITAXEL (PAK li TAX el) is a chemotherapy drug. It targets fast dividing cells, like cancer cells, and causes these cells to die. This medicine is used to treat ovarian cancer, breast cancer, lung cancer, Kaposi's sarcoma, and other cancers. This medicine may be used for other purposes; ask your health care provider or pharmacist if you have questions. COMMON BRAND NAME(S): Onxol, Taxol What should I tell my health care provider before I take this medicine? They need to know if you have any of these conditions:  history of irregular heartbeat  liver disease  low blood counts, like low white cell, platelet, or red cell counts  lung or breathing disease, like asthma  tingling of the fingers or toes, or other nerve disorder  an unusual or allergic reaction to paclitaxel, alcohol,  polyoxyethylated castor oil, other chemotherapy, other medicines, foods, dyes, or preservatives  pregnant or trying to get pregnant  breast-feeding How should I use this medicine? This drug is given as an infusion into a vein. It is administered in a hospital or clinic by a specially trained health care professional. Talk to your pediatrician regarding the use of this medicine in children. Special care may be needed. Overdosage: If you think you have taken too much of this medicine contact a poison control center or emergency room at once. NOTE: This medicine is only for you. Do not share this medicine with others. What if I miss a dose? It is important not to miss your dose. Call your doctor or health care professional if you are unable to keep an appointment. What may interact with this medicine? Do not take this medicine with any of the following medications:  live virus vaccines This medicine may also interact with the following medications:  antiviral medicines for hepatitis, HIV or AIDS  certain antibiotics like erythromycin and clarithromycin  certain medicines for fungal infections like ketoconazole and itraconazole  certain medicines for seizures like carbamazepine, phenobarbital, phenytoin  gemfibrozil  nefazodone  rifampin  St. John's wort This list may not describe all possible interactions. Give your health care provider a list of all the medicines, herbs, non-prescription drugs, or dietary supplements you use. Also tell them if you smoke, drink alcohol, or use illegal drugs. Some items may interact with your medicine. What should I watch for while using this medicine? Your condition will be monitored carefully while you are receiving this medicine. You will need important  blood work done while you are taking this medicine. This medicine can cause serious allergic reactions. To reduce your risk you will need to take other medicine(s) before treatment with this  medicine. If you experience allergic reactions like skin rash, itching or hives, swelling of the face, lips, or tongue, tell your doctor or health care professional right away. In some cases, you may be given additional medicines to help with side effects. Follow all directions for their use. This drug may make you feel generally unwell. This is not uncommon, as chemotherapy can affect healthy cells as well as cancer cells. Report any side effects. Continue your course of treatment even though you feel ill unless your doctor tells you to stop. Call your doctor or health care professional for advice if you get a fever, chills or sore throat, or other symptoms of a cold or flu. Do not treat yourself. This drug decreases your body's ability to fight infections. Try to avoid being around people who are sick. This medicine may increase your risk to bruise or bleed. Call your doctor or health care professional if you notice any unusual bleeding. Be careful brushing and flossing your teeth or using a toothpick because you may get an infection or bleed more easily. If you have any dental work done, tell your dentist you are receiving this medicine. Avoid taking products that contain aspirin, acetaminophen, ibuprofen, naproxen, or ketoprofen unless instructed by your doctor. These medicines may hide a fever. Do not become pregnant while taking this medicine. Women should inform their doctor if they wish to become pregnant or think they might be pregnant. There is a potential for serious side effects to an unborn child. Talk to your health care professional or pharmacist for more information. Do not breast-feed an infant while taking this medicine. Men are advised not to father a child while receiving this medicine. This product may contain alcohol. Ask your pharmacist or healthcare provider if this medicine contains alcohol. Be sure to tell all healthcare providers you are taking this medicine. Certain medicines,  like metronidazole and disulfiram, can cause an unpleasant reaction when taken with alcohol. The reaction includes flushing, headache, nausea, vomiting, sweating, and increased thirst. The reaction can last from 30 minutes to several hours. What side effects may I notice from receiving this medicine? Side effects that you should report to your doctor or health care professional as soon as possible:  allergic reactions like skin rash, itching or hives, swelling of the face, lips, or tongue  breathing problems  changes in vision  fast, irregular heartbeat  high or low blood pressure  mouth sores  pain, tingling, numbness in the hands or feet  signs of decreased platelets or bleeding - bruising, pinpoint red spots on the skin, black, tarry stools, blood in the urine  signs of decreased red blood cells - unusually weak or tired, feeling faint or lightheaded, falls  signs of infection - fever or chills, cough, sore throat, pain or difficulty passing urine  signs and symptoms of liver injury like dark yellow or brown urine; general ill feeling or flu-like symptoms; light-colored stools; loss of appetite; nausea; right upper belly pain; unusually weak or tired; yellowing of the eyes or skin  swelling of the ankles, feet, hands  unusually slow heartbeat Side effects that usually do not require medical attention (report to your doctor or health care professional if they continue or are bothersome):  diarrhea  hair loss  loss of appetite  muscle or joint   pain  nausea, vomiting  pain, redness, or irritation at site where injected  tiredness This list may not describe all possible side effects. Call your doctor for medical advice about side effects. You may report side effects to FDA at 1-800-FDA-1088. Where should I keep my medicine? This drug is given in a hospital or clinic and will not be stored at home. NOTE: This sheet is a summary. It may not cover all possible information.  If you have questions about this medicine, talk to your doctor, pharmacist, or health care provider.  2021 Elsevier/Gold Standard (2019-08-05 13:37:23)  Carboplatin injection What is this medicine? CARBOPLATIN (KAR boe pla tin) is a chemotherapy drug. It targets fast dividing cells, like cancer cells, and causes these cells to die. This medicine is used to treat ovarian cancer and many other cancers. This medicine may be used for other purposes; ask your health care provider or pharmacist if you have questions. COMMON BRAND NAME(S): Paraplatin What should I tell my health care provider before I take this medicine? They need to know if you have any of these conditions:  blood disorders  hearing problems  kidney disease  recent or ongoing radiation therapy  an unusual or allergic reaction to carboplatin, cisplatin, other chemotherapy, other medicines, foods, dyes, or preservatives  pregnant or trying to get pregnant  breast-feeding How should I use this medicine? This drug is usually given as an infusion into a vein. It is administered in a hospital or clinic by a specially trained health care professional. Talk to your pediatrician regarding the use of this medicine in children. Special care may be needed. Overdosage: If you think you have taken too much of this medicine contact a poison control center or emergency room at once. NOTE: This medicine is only for you. Do not share this medicine with others. What if I miss a dose? It is important not to miss a dose. Call your doctor or health care professional if you are unable to keep an appointment. What may interact with this medicine?  medicines for seizures  medicines to increase blood counts like filgrastim, pegfilgrastim, sargramostim  some antibiotics like amikacin, gentamicin, neomycin, streptomycin, tobramycin  vaccines Talk to your doctor or health care professional before taking any of these  medicines:  acetaminophen  aspirin  ibuprofen  ketoprofen  naproxen This list may not describe all possible interactions. Give your health care provider a list of all the medicines, herbs, non-prescription drugs, or dietary supplements you use. Also tell them if you smoke, drink alcohol, or use illegal drugs. Some items may interact with your medicine. What should I watch for while using this medicine? Your condition will be monitored carefully while you are receiving this medicine. You will need important blood work done while you are taking this medicine. This drug may make you feel generally unwell. This is not uncommon, as chemotherapy can affect healthy cells as well as cancer cells. Report any side effects. Continue your course of treatment even though you feel ill unless your doctor tells you to stop. In some cases, you may be given additional medicines to help with side effects. Follow all directions for their use. Call your doctor or health care professional for advice if you get a fever, chills or sore throat, or other symptoms of a cold or flu. Do not treat yourself. This drug decreases your body's ability to fight infections. Try to avoid being around people who are sick. This medicine may increase your risk to bruise  or bleed. Call your doctor or health care professional if you notice any unusual bleeding. Be careful brushing and flossing your teeth or using a toothpick because you may get an infection or bleed more easily. If you have any dental work done, tell your dentist you are receiving this medicine. Avoid taking products that contain aspirin, acetaminophen, ibuprofen, naproxen, or ketoprofen unless instructed by your doctor. These medicines may hide a fever. Do not become pregnant while taking this medicine. Women should inform their doctor if they wish to become pregnant or think they might be pregnant. There is a potential for serious side effects to an unborn child. Talk  to your health care professional or pharmacist for more information. Do not breast-feed an infant while taking this medicine. What side effects may I notice from receiving this medicine? Side effects that you should report to your doctor or health care professional as soon as possible:  allergic reactions like skin rash, itching or hives, swelling of the face, lips, or tongue  signs of infection - fever or chills, cough, sore throat, pain or difficulty passing urine  signs of decreased platelets or bleeding - bruising, pinpoint red spots on the skin, black, tarry stools, nosebleeds  signs of decreased red blood cells - unusually weak or tired, fainting spells, lightheadedness  breathing problems  changes in hearing  changes in vision  chest pain  high blood pressure  low blood counts - This drug may decrease the number of white blood cells, red blood cells and platelets. You may be at increased risk for infections and bleeding.  nausea and vomiting  pain, swelling, redness or irritation at the injection site  pain, tingling, numbness in the hands or feet  problems with balance, talking, walking  trouble passing urine or change in the amount of urine Side effects that usually do not require medical attention (report to your doctor or health care professional if they continue or are bothersome):  hair loss  loss of appetite  metallic taste in the mouth or changes in taste This list may not describe all possible side effects. Call your doctor for medical advice about side effects. You may report side effects to FDA at 1-800-FDA-1088. Where should I keep my medicine? This drug is given in a hospital or clinic and will not be stored at home. NOTE: This sheet is a summary. It may not cover all possible information. If you have questions about this medicine, talk to your doctor, pharmacist, or health care provider.  2021 Elsevier/Gold Standard (2007-12-09 14:38:05)   PICC  Home Care Guide A peripherally inserted central catheter (PICC) is a form of IV access that allows medicines and IV fluids to be quickly distributed throughout the body. The PICC is a long, thin, flexible tube (catheter) that is inserted into a vein in the upper arm. The catheter ends in a large vein in the chest (superior vena cava, or SVC). After the PICC is inserted, a chest X-ray may be done to make sure that it is in the correct place. A PICC may be placed for different reasons, such as:  To give medicines and liquid nutrition.  To give IV fluids and blood products.  If there is trouble placing a peripheral intravenous (PIV) catheter. If taken care of properly, a PICC can remain in place for several months. Having a PICC can also allow a person to go home from the hospital sooner. Medicine and PICC care can be managed at home by a family  member, caregiver, or home health care team. What are the risks? Generally, having a PICC is safe. However, problems may occur, including:  A blood clot (thrombus) forming in or at the tip of the PICC.  A blood clot forming in a vein (deep vein thrombosis) or traveling to the lung (pulmonary embolism).  Inflammation of the vein (phlebitis) in which the PICC is placed.  Infection. Central line associated blood stream infection (CLABSI) is a serious infection that often requires hospitalization.  PICC movement (malposition). The PICC tip may move from its original position due to excessive physical activity, forceful coughing, sneezing, or vomiting.  A break or cut in the PICC. It is important not to use scissors near the PICC.  Nerve or tendon irritation or injury during PICC insertion. How to take care of your PICC Preventing problems  You and any caregivers should wash your hands often with soap. Wash hands: ? Before touching the PICC line or the infusion device. ? Before changing a bandage (dressing).  Flush the PICC as told by your health  care provider. Let your health care provider know right away if the PICC is hard to flush or does not flush. Do not use force to flush the PICC.  Do not use a syringe that is less than 10 mL to flush the PICC.  Avoid blood pressure checks on the arm in which the PICC is placed.  Never pull or tug on the PICC.  Do not take the PICC out yourself. Only a trained clinical professional should remove the PICC.  Use clean and sterile supplies only. Keep the supplies in a dry place. Do not reuse needles, syringes, or any other supplies. Doing that can lead to infection.  Keep pets and children away from your PICC line.  Check the PICC insertion site every day for signs of infection. Check for: ? Leakage. ? Redness, swelling, or pain. ? Fluid or blood. ? Warmth. ? Pus or a bad smell. PICC dressing care  Keep your PICC bandage (dressing) clean and dry to prevent infection.  Do not take baths, swim, or use a hot tub until your health care provider approves. Ask your health care provider if you can take showers. You may only be allowed to take sponge baths for bathing. When you are allowed to shower: ? Ask your health care provider to teach you how to wrap the PICC line. ? Cover the PICC line with clear plastic wrap and tape to keep it dry while showering.  Follow instructions from your health care provider about how to take care of your insertion site and dressing. Make sure you: ? Wash your hands with soap and water before you change your bandage (dressing). If soap and water are not available, use hand sanitizer. ? Change your dressing as told by your health care provider. ? Leave stitches (sutures), skin glue, or adhesive strips in place. These skin closures may need to stay in place for 2 weeks or longer. If adhesive strip edges start to loosen and curl up, you may trim the loose edges. Do not remove adhesive strips completely unless your health care provider tells you to do that.  Change  your PICC dressing if it becomes loose or wet. General instructions  Carry your PICC identification card or wear a medical alert bracelet at all times.  Keep the tube clamped at all times, unless it is being used.  Carry a smooth-edge clamp with you at all times to place on the  tube if it breaks.  Do not use scissors or sharp objects near the tube.  You may bend your arm and move it freely. If your PICC is near or at the bend of your elbow, avoid activity with repeated motion at the elbow.  Avoid lifting heavy objects as told by your health care provider.  Keep all follow-up visits as told by your health care provider. This is important.   Disposal of supplies  Throw away any syringes in a disposal container that is meant for sharp items (sharps container). You can buy a sharps container from a pharmacy, or you can make one by using an empty hard plastic bottle with a cover.  Place any used dressings or infusion bags into a plastic bag. Throw that bag in the trash. Contact a health care provider if:  You have pain in your arm, ear, face, or teeth.  You have a fever or chills.  You have redness, swelling, or pain around the insertion site.  You have fluid or blood coming from the insertion site.  Your insertion site feels warm to the touch.  You have pus or a bad smell coming from the insertion site.  Your skin feels hard and raised around the insertion site. Get help right away if:  Your PICC is accidentally pulled all the way out. If this happens, cover the insertion site with a bandage or gauze dressing. Do not throw the PICC away. Your health care provider will need to check it.  Your PICC was tugged or pulled and has partially come out. Do not  push the PICC back in.  You cannot flush the PICC, it is hard to flush, or the PICC leaks around the insertion site when it is flushed.  You hear a "flushing" sound when the PICC is flushed.  You feel your heart racing or  skipping beats.  There is a hole or tear in the PICC.  You have swelling in the arm in which the PICC was inserted.  You have a red streak going up your arm from where the PICC was inserted. Summary  A peripherally inserted central catheter (PICC) is a long, thin, flexible tube (catheter) that is inserted into a vein in the upper arm.  The PICC is inserted using a sterile technique by a specially trained nurse or physician. Only a trained clinical professional should remove it.  Keep your PICC identification card with you at all times.  Avoid blood pressure checks on the arm in which the PICC is placed.  If cared for properly, a PICC can remain in place for several months. Having a PICC can also allow a person to go home from the hospital sooner. This information is not intended to replace advice given to you by your health care provider. Make sure you discuss any questions you have with your health care provider. Document Revised: 01/13/2020 Document Reviewed: 01/13/2020 Elsevier Patient Education  2021 [employer].

## 2020-10-07 NOTE — Progress Notes (Signed)
At discharge no blood return from PICC.  Line flushes with no pain.  Per Dr. Alvy Bimler OK to discharge patient.

## 2020-10-07 NOTE — Progress Notes (Signed)
[place] OFFICE PROGRESS NOTE  Patient Care Team: Donald Prose, MD as PCP - General (Family Medicine)  ASSESSMENT & PLAN:  Endometrial cancer [facility]) Clinically, she is feeling better Her pain is well controlled She is able to hydrate adequately For now, we will put the plan of care with advanced home care on hold given staff shortage I plan to see her again early next week for further follow-up and supportive care Biopsy from peritoneum is pending but I think we should proceed with combination carboplatin and Taxol without further delay She is in agreement to proceed  Cancer associated pain Her pain is well controlled We discussed narcotic refill policy  Nausea without vomiting She will continue antiemetics  S/P PICC central line placement I recommend PICC line care here weekly She will continue PICC line flushes at home   No orders of the defined types were placed in this encounter.   All questions were answered. The patient knows to call the clinic with any problems, questions or concerns. The total time spent in the appointment was 20 minutes encounter with patients including review of chart and various tests results, discussions about plan of care and coordination of care plan   Heath Lark, MD 10/07/2020 4:04 PM  INTERVAL HISTORY: Please see below for problem oriented charting. She is seen prior to starting chemotherapy Her pain is well controlled She is able to eat better She is still having regular bowel movement She had 1 episode of nausea vomiting recently, resolved She felt much better today compared to several days ago  SUMMARY OF ONCOLOGIC HISTORY: Oncology History  Endometrial cancer ([place])  08/17/2019 Initial Diagnosis   Endometrial cancer ([place]) The patient reported having menstrual-like cyclical bleeding for approximately 2 years beginning in 2018.  In April 2020 she then began passing heavy clots for approximately 3 months.  With  subsequent lighter vaginal persistent daily bleeding.  She was eventually seen by Dr. Nelda Marseille who performed a transvaginal ultrasound scan on July 30, 2019 which revealed a uterus measuring 11.6 x 7.2 x 6.7 cm.  The endometrium was 6 mm in thickness.  The right and left ovaries were grossly normal.   Due to the endometrial thickening she underwent an office transvaginal biopsy on August 06, 2019.  This revealed FIGO grade 2 endometrioid adenocarcinoma.   09/28/2019 Surgery   Surgeon: Donaciano Eva    Assistants: Joylene John, NP (a provider assistant was necessary for tissue manipulation, management of robotic instrumentation, retraction and positioning due to the complexity of the case and hospital policies).    Anesthesia: General endotracheal anesthesia    Operation: Robotic-assisted laparoscopic total hysterectomy >250gm with bilateral salpingoophorectomy, SLN biopsy  Operative Findings:  12cm bulky uterus without gross extrauterine disease. Bulky lower uterine segment and endocervix, but no gross cervical disease, no suspicious nodes. Uterus weighed in OR - 381gm.     04/07/2020 Imaging   Intramammary lymph node in the LEFT breast accounts for the mammographic abnormality. No mammographic or ultrasound evidence for malignancy.   09/21/2020 Imaging   1. Findings of diffuse peritoneal carcinomatosis, with serosal implants along the liver, omental caking, and multiple mesenteric soft tissue nodules as above. Findings are compatible with metastatic endometrial cancer. 2. Abdominal and pelvic ascites. 3.  Aortic Atherosclerosis (ICD10-I70.0).   09/27/2020 Pathology Results   A. LYMPH NODE, LEFT OBTURATOR, SENTINEL, BIOPSY:  - Lymph node, negative for carcinoma (0/1).  See comment   B. LYMPH NODE, RIGHT DEEP OBTURATOR, SENTINEL, BIOPSY:  -  Lymph node, negative for carcinoma (0/1).  See comment   C. UTERUS, CERVIX, BILATERAL OVARIES AND FALLOPIAN TUBES:  - Endometrioid  adenocarcinoma, FIGO grade 1, 12 cm, involving anterior and posterior endometrium in a carpet-like fashion  - Carcinoma invades into the myometrium for an approximate depth of 0.4 cm where the myometrial thickness is about 2.5 cm  - Lymphovascular invasion is present  - Adenomyosis  - Unremarkable cervix  - Unremarkable bilateral fallopian tubes and ovaries  - See oncology table   COMMENT:   A and B.   Immunohistochemical stain for pancytokeratin (AE1/AE3) is negative for metastatic carcinoma.   ONCOLOGY TABLE:   UTERUS, CARCINOMA OR CARCINOSARCOMA   Procedure: Total hysterectomy and bilateral salpingo-oophorectomy  Histologic type: Endometrioid adenocarcinoma  Histologic Grade: FIGO grade 1  Myometrial invasion:       Depth of invasion: 4 mm       Myometrial thickness: 25 mm  Uterine Serosa Involvement: Not identified  Cervical stromal involvement: Not identified  Extent of involvement of other organs: Not identified  Lymphovascular invasion: Present  Regional Lymph Nodes:       Examined:     2 Sentinel                               0 Non-sentinel                               2 Total        Lymph nodes with metastasis: 0        Isolated tumor cells (<0.2 mm): 0        Micrometastasis: (>0.2 mm and < 2.0 mm): 0        Macrometastasis: (>2.0 mm): 0        Extracapsular extension: Not applicable  Representative Tumor Block: C8  MMR / MSI testing: Will be ordered  Pathologic Stage Classification (pTNM, AJCC 8th edition):  pT1a, pN0  FIGO Stage (2015 FIGO Cancer Report):  IA    10/04/2020 Cancer Staging   Staging form: Corpus Uteri - Carcinoma and Carcinosarcoma, AJCC 8th Edition - Pathologic stage from 10/04/2020: FIGO Stage IA (pT1a, pN0, cM0) - Signed by Heath Lark, MD on 10/04/2020   10/04/2020 Cancer Staging   Staging form: Corpus Uteri - Carcinoma and Carcinosarcoma, AJCC 8th Edition - Clinical stage from 10/04/2020: FIGO Stage IVB (rcT3, cN0, cM1) - Signed by Heath Lark, MD on 10/04/2020   10/05/2020 Procedure   Successful ultrasound and fluoroscopic guided placement of a right basilic vein approach, 39 cm, 5 French, single lumen PICC with tip at the superior caval-atrial junction. The PICC line is ready for immediate use.   10/05/2020 Procedure   1. Technically successful CT guided core needle biopsy of omental caking within the ventral aspect of mid abdomen. 2. Technically successful image guided paracentesis yielding 2.6 L serous fluid. A representative sample of aspirated fluid was capped sent to the laboratory for cytologic analysis.     REVIEW OF SYSTEMS:   Constitutional: Denies fevers, chills or abnormal weight loss Eyes: Denies blurriness of vision Ears, nose, mouth, throat, and face: Denies mucositis or sore throat Respiratory: Denies cough, dyspnea or wheezes Cardiovascular: Denies palpitation, chest discomfort or lower extremity swelling Skin: Denies abnormal skin rashes Lymphatics: Denies new lymphadenopathy or easy bruising Neurological:Denies numbness, tingling or new weaknesses Behavioral/Psych: Mood is stable, no new changes  All other systems were reviewed with the patient and are negative.  I have reviewed the past medical history, past surgical history, social history and family history with the patient and they are unchanged from previous note.  ALLERGIES:  is allergic to other and sulfacetamide sodium-sulfur.  MEDICATIONS:  Current Outpatient Medications  Medication Sig Dispense Refill  . Sodium Chloride Flush (NORMAL SALINE FLUSH) 0.9 % SOLN Inject 10 mLs into the vein daily. 300 mL 11  . dexamethasone (DECADRON) 4 MG tablet Take 2 tabs at the night before and 2 tab the morning of chemotherapy, every 3 weeks, by mouth x 6 cycles 36 tablet 6  . docusate sodium (COLACE) 100 MG capsule Take 100 mg by mouth daily.    . Lidocaine-Menthol (ICY HOT LIDOCAINE PLUS MENTHOL EX) Apply 1 application topically daily as needed (pain).    .  montelukast (SINGULAIR) 10 MG tablet Take 10 mg by mouth daily.    . ondansetron (ZOFRAN ODT) 4 MG disintegrating tablet Take 1 tablet (4 mg total) by mouth every 8 (eight) hours as needed for nausea or vomiting. (Patient not taking: No sig reported) 20 tablet 0  . oxyCODONE (ROXICODONE) 15 MG immediate release tablet Take 1 tablet (15 mg total) by mouth every 4 (four) hours as needed for severe pain. 60 tablet 0  . PROAIR RESPICLICK 301 (90 Base) MCG/ACT AEPB Inhale 1 puff into the lungs every 4 (four) hours as needed (wheezing/shortness of breath).     . promethazine (PHENERGAN) 25 MG tablet Take 1 tablet (25 mg total) by mouth every 6 (six) hours as needed for nausea or vomiting. 60 tablet 1   No current facility-administered medications for this visit.   Facility-Administered Medications Ordered in Other Visits  Medication Dose Route Frequency Provider Last Rate Last Admin  . CARBOplatin (PARAPLATIN) 610 mg in sodium chloride 0.9 % 250 mL chemo infusion  610 mg Intravenous Once Alvy Bimler, Luismiguel Lamere, MD      . heparin lock flush 100 unit/mL  250 Units Intracatheter Once PRN Alvy Bimler, Georgio Hattabaugh, MD      . sodium chloride flush (NS) 0.9 % injection 10 mL  10 mL Intracatheter PRN Alvy Bimler, Rafiq Bucklin, MD        PHYSICAL EXAMINATION: ECOG PERFORMANCE STATUS: 1 - Symptomatic but completely ambulatory  Vitals:   10/07/20 0806  BP: 138/83  Pulse: (!) 103  Resp: 18  Temp: 98.3 F (36.8 C)  SpO2: 97%   Filed Weights   10/07/20 0806  Weight: 237 lb 9.6 oz (107.8 kg)    GENERAL:alert, no distress and comfortable NEURO: alert & oriented x 3 with fluent speech, no focal motor/sensory deficits  LABORATORY DATA:  I have reviewed the data as listed    Component Value Date/Time   NA 142 10/04/2020 1315   K 4.0 10/04/2020 1315   CL 106 10/04/2020 1315   CO2 21 (L) 10/04/2020 1315   GLUCOSE 127 (H) 10/04/2020 1315   BUN 21 10/04/2020 1315   CREATININE 1.04 (H) 10/04/2020 1315   CALCIUM 9.5 10/04/2020 1315    PROT 8.2 (H) 10/04/2020 1315   ALBUMIN 2.8 (L) 10/04/2020 1315   AST 56 (H) 10/04/2020 1315   ALT 14 10/04/2020 1315   ALKPHOS 134 (H) 10/04/2020 1315   BILITOT 0.3 10/04/2020 1315   GFRNONAA >60 10/04/2020 1315   GFRAA >60 09/22/2019 1008    No results found for: SPEP, UPEP  Lab Results  Component Value Date   WBC 7.6 10/04/2020   NEUTROABS  6.1 10/04/2020   HGB 13.4 10/04/2020   HCT 41.6 10/04/2020   MCV 88.5 10/04/2020   PLT 435 (H) 10/04/2020      Chemistry      Component Value Date/Time   NA 142 10/04/2020 1315   K 4.0 10/04/2020 1315   CL 106 10/04/2020 1315   CO2 21 (L) 10/04/2020 1315   BUN 21 10/04/2020 1315   CREATININE 1.04 (H) 10/04/2020 1315      Component Value Date/Time   CALCIUM 9.5 10/04/2020 1315   ALKPHOS 134 (H) 10/04/2020 1315   AST 56 (H) 10/04/2020 1315   ALT 14 10/04/2020 1315   BILITOT 0.3 10/04/2020 1315       RADIOGRAPHIC STUDIES: I have personally reviewed the radiological images as listed and agreed with the findings in the report. CT ABDOMEN PELVIS W CONTRAST  Result Date: 09/21/2020 CLINICAL DATA:  Abdominal distension for 1 month, history of endometrial cancer EXAM: CT ABDOMEN AND PELVIS WITH CONTRAST TECHNIQUE: Multidetector CT imaging of the abdomen and pelvis was performed using the standard protocol following bolus administration of intravenous contrast. CONTRAST:  171m OMNIPAQUE IOHEXOL 300 MG/ML  SOLN COMPARISON:  07/30/2019 FINDINGS: Lower chest: No acute pleural or parenchymal lung disease. Hepatobiliary: Numerous serosal implants are seen along the liver, consistent with metastatic disease in a patient with a history of endometrial cancer. Largest of these inferior right lobe liver measuring 3.3 x 2.9 cm. There are no focal liver parenchymal abnormalities. The gallbladder is unremarkable. Pancreas: Unremarkable. No pancreatic ductal dilatation or surrounding inflammatory changes. Spleen: Normal in size without focal abnormality.  Adrenals/Urinary Tract: Adrenal glands are unremarkable. Kidneys are normal, without renal calculi, focal lesion, or hydronephrosis. Bladder is unremarkable. Stomach/Bowel: No bowel obstruction or ileus. The appendix is surgically absent. No bowel wall thickening or inflammatory change. Vascular/Lymphatic: Minimal atherosclerosis of the aorta. No discrete adenopathy. Reproductive: Uterus and adnexal structures are surgically absent. Other: Soft tissue masses are seen throughout the mesentery, consistent with metastatic disease and peritoneal carcinomatosis. Omental caking in the ventral abdomen measures up to 5.1 cm in thickness. Soft tissue nodules are seen within the lower pelvis as well. There is free fluid throughout the abdomen and pelvis. No free intraperitoneal gas. No abdominal wall hernia. Musculoskeletal: No acute or destructive bony lesions. 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 Reconstructed images demonstrate no additional findings. IMPRESSION: 1. Findings of diffuse peritoneal carcinomatosis, with serosal implants along the liver, omental caking, and multiple mesenteric soft tissue nodules as above. Findings are compatible with metastatic endometrial cancer. 2. Abdominal and pelvic ascites. 3.  Aortic Atherosclerosis (ICD10-I70.0). Electronically Signed   By: MRanda NgoM.D.   On: 09/21/2020 02:28   CT BIOPSY  Result Date: 10/05/2020 INDICATION: History of endometrial cancer, now with omental caking and intra-abdominal ascites worrisome for metastatic disease. Please perform omental cake biopsy and image guided paracentesis for diagnostic and therapeutic purposes. EXAM: 1. CT-GUIDED OMENTAL CAKING BIOPSY 2. ULTRASOUND-GUIDED PARACENTESIS COMPARISON:  CT abdomen pelvis-09/21/2020 MEDICATIONS: None. ANESTHESIA/SEDATION: Fentanyl 25 mcg IV; Versed 1.5 mg IV Sedation time: 28 minutes; The patient was continuously monitored during the procedure by the interventional  radiology nurse under my direct supervision. CONTRAST:  None. COMPLICATIONS: None immediate. PROCEDURE: Informed consent was obtained from the patient following an explanation of the procedure, risks, benefits and alternatives. A time out was performed prior to the initiation of the procedure. The patient was positioned supine on the CT table and a limited CT was performed for  procedural planning demonstrating omental caking involving the ventral aspect of the mid abdomen with dominant component measuring approximately 16.2 x 5.3 cm (image 48, series 2). Sonographic evaluation was then performed of the right mid hemiabdomen demonstrated an adequate percutaneous window to the moderate volume intra-abdominal ascites The procedures were planned. The operative sites were prepped and draped in the usual sterile fashion. Under direct ultrasound guidance, pocket of fluid within the right lateral aspect of the mid abdomen was accessed with a Safe-T-Centesis catheter. Ultrasound image was saved for procedural documentation purposes. Next, the paracentesis performed ultimately yielding 2.6 L of serous fluid. A representative sample was capped and sent to the laboratory for cytologic analysis. Attention was now paid towards the omental mass biopsy. Appropriate trajectory was confirmed with a 22 gauge spinal needle after the adjacent tissues were anesthetized with 1% Lidocaine with epinephrine. Under intermittent CT guidance, a 17 gauge coaxial needle was advanced into the peripheral aspect of the omental mass. Appropriate positioning was confirmed and 8 core needle biopsy samples were obtained with an 18 gauge core needle biopsy device. The co-axial needle was removed following administration of a Gel-Foam slurry and superficial hemostasis was achieved with manual compression. A limited postprocedural CT demonstrated significant reduction/near resolution of intra-abdominal ascites and was negative for hemorrhage or additional  complication. A dressing was placed. The patient tolerated the procedure well without immediate postprocedural complication. IMPRESSION: 1. Technically successful CT guided core needle biopsy of omental caking within the ventral aspect of mid abdomen. 2. Technically successful image guided paracentesis yielding 2.6 L serous fluid. A representative sample of aspirated fluid was capped sent to the laboratory for cytologic analysis. Electronically Signed   By: Sandi Mariscal M.D.   On: 10/05/2020 13:21   IR PICC PLACEMENT RIGHT >5 YRS INC IMG GUIDE  Result Date: 10/05/2020 INDICATION: Patient with a history of endometrial cancer now with peritoneal carcinomatosis. Interventional radiology asked to place a PICC for chemotherapy. EXAM: ULTRASOUND AND FLUOROSCOPIC GUIDED PICC LINE INSERTION MEDICATIONS: 1% lidocaine 2 mL CONTRAST:  None FLUOROSCOPY TIME:  24 seconds (3 mGy) COMPLICATIONS: None immediate. TECHNIQUE: The procedure, risks, benefits, and alternatives were explained to the patient and informed written consent was obtained. The right upper extremity was prepped with chlorhexidine in a sterile fashion, and a sterile drape was applied covering the operative field. Maximum barrier sterile technique with sterile gowns and gloves were used for the procedure. A timeout was performed prior to the initiation of the procedure. Local anesthesia was provided with 1% lidocaine. After the overlying soft tissues were anesthetized with 1% lidocaine, a micropuncture kit was utilized to access the right basilic vein. Real-time ultrasound guidance was utilized for vascular access including the acquisition of a permanent ultrasound image documenting patency of the accessed vessel. A guidewire was advanced to the level of the superior caval-atrial junction for measurement purposes and the PICC line was cut to length. A peel-away sheath was placed and a 39 cm, 5 [place], single lumen was inserted to level of the superior  caval-atrial junction. A post procedure spot fluoroscopic was obtained. The catheter easily aspirated and flushed and was secured in place. A dressing was placed. The patient tolerated the procedure well without immediate post procedural complication. FINDINGS: After catheter placement, the tip lies within the superior cavoatrial junction. The catheter aspirates and flushes normally and is ready for immediate use. IMPRESSION: Successful ultrasound and fluoroscopic guided placement of a right basilic vein approach, 39 cm, 5 French, single lumen PICC with tip  at the superior caval-atrial junction. The PICC line is ready for immediate use. Read by: Soyla Dryer, NP Electronically Signed   By: Sandi Mariscal M.D.   On: 10/05/2020 10:45   IR Paracentesis  Result Date: 10/05/2020 INDICATION: History of endometrial cancer, now with omental caking and intra-abdominal ascites worrisome for metastatic disease. Please perform omental cake biopsy and image guided paracentesis for diagnostic and therapeutic purposes. EXAM: 1. CT-GUIDED OMENTAL CAKING BIOPSY 2. ULTRASOUND-GUIDED PARACENTESIS COMPARISON:  CT abdomen pelvis-09/21/2020 MEDICATIONS: None. ANESTHESIA/SEDATION: Fentanyl 25 mcg IV; Versed 1.5 mg IV Sedation time: 28 minutes; The patient was continuously monitored during the procedure by the interventional radiology nurse under my direct supervision. CONTRAST:  None. COMPLICATIONS: None immediate. PROCEDURE: Informed consent was obtained from the patient following an explanation of the procedure, risks, benefits and alternatives. A time out was performed prior to the initiation of the procedure. The patient was positioned supine on the CT table and a limited CT was performed for procedural planning demonstrating omental caking involving the ventral aspect of the mid abdomen with dominant component measuring approximately 16.2 x 5.3 cm (image 48, series 2). Sonographic evaluation was then performed of the right mid  hemiabdomen demonstrated an adequate percutaneous window to the moderate volume intra-abdominal ascites The procedures were planned. The operative sites were prepped and draped in the usual sterile fashion. Under direct ultrasound guidance, pocket of fluid within the right lateral aspect of the mid abdomen was accessed with a Safe-T-Centesis catheter. Ultrasound image was saved for procedural documentation purposes. Next, the paracentesis performed ultimately yielding 2.6 L of serous fluid. A representative sample was capped and sent to the laboratory for cytologic analysis. Attention was now paid towards the omental mass biopsy. Appropriate trajectory was confirmed with a 22 gauge spinal needle after the adjacent tissues were anesthetized with 1% Lidocaine with epinephrine. Under intermittent CT guidance, a 17 gauge coaxial needle was advanced into the peripheral aspect of the omental mass. Appropriate positioning was confirmed and 8 core needle biopsy samples were obtained with an 18 gauge core needle biopsy device. The co-axial needle was removed following administration of a Gel-Foam slurry and superficial hemostasis was achieved with manual compression. A limited postprocedural CT demonstrated significant reduction/near resolution of intra-abdominal ascites and was negative for hemorrhage or additional complication. A dressing was placed. The patient tolerated the procedure well without immediate postprocedural complication. IMPRESSION: 1. Technically successful CT guided core needle biopsy of omental caking within the ventral aspect of mid abdomen. 2. Technically successful image guided paracentesis yielding 2.6 L serous fluid. A representative sample of aspirated fluid was capped sent to the laboratory for cytologic analysis. Electronically Signed   By: Sandi Mariscal M.D.   On: 10/05/2020 13:21

## 2020-10-10 ENCOUNTER — Inpatient Hospital Stay: Payer: Federal, State, Local not specified - PPO

## 2020-10-10 ENCOUNTER — Inpatient Hospital Stay: Payer: Federal, State, Local not specified - PPO | Admitting: Hematology and Oncology

## 2020-10-10 ENCOUNTER — Telehealth: Payer: Self-pay | Admitting: *Deleted

## 2020-10-10 ENCOUNTER — Other Ambulatory Visit: Payer: Self-pay

## 2020-10-10 ENCOUNTER — Other Ambulatory Visit: Payer: Self-pay | Admitting: Oncology

## 2020-10-10 DIAGNOSIS — R630 Anorexia: Secondary | ICD-10-CM | POA: Diagnosis not present

## 2020-10-10 DIAGNOSIS — C541 Malignant neoplasm of endometrium: Secondary | ICD-10-CM | POA: Diagnosis not present

## 2020-10-10 DIAGNOSIS — C787 Secondary malignant neoplasm of liver and intrahepatic bile duct: Secondary | ICD-10-CM | POA: Diagnosis not present

## 2020-10-10 DIAGNOSIS — C786 Secondary malignant neoplasm of retroperitoneum and peritoneum: Secondary | ICD-10-CM | POA: Diagnosis not present

## 2020-10-10 DIAGNOSIS — Z5111 Encounter for antineoplastic chemotherapy: Secondary | ICD-10-CM | POA: Diagnosis not present

## 2020-10-10 DIAGNOSIS — R188 Other ascites: Secondary | ICD-10-CM | POA: Diagnosis not present

## 2020-10-10 DIAGNOSIS — K5909 Other constipation: Secondary | ICD-10-CM | POA: Diagnosis not present

## 2020-10-10 DIAGNOSIS — Z90710 Acquired absence of both cervix and uterus: Secondary | ICD-10-CM | POA: Diagnosis not present

## 2020-10-10 DIAGNOSIS — Z90722 Acquired absence of ovaries, bilateral: Secondary | ICD-10-CM | POA: Diagnosis not present

## 2020-10-10 DIAGNOSIS — Z95828 Presence of other vascular implants and grafts: Secondary | ICD-10-CM

## 2020-10-10 DIAGNOSIS — R6881 Early satiety: Secondary | ICD-10-CM | POA: Diagnosis not present

## 2020-10-10 DIAGNOSIS — R110 Nausea: Secondary | ICD-10-CM

## 2020-10-10 DIAGNOSIS — R140 Abdominal distension (gaseous): Secondary | ICD-10-CM | POA: Diagnosis not present

## 2020-10-10 DIAGNOSIS — Z8542 Personal history of malignant neoplasm of other parts of uterus: Secondary | ICD-10-CM | POA: Diagnosis not present

## 2020-10-10 DIAGNOSIS — G893 Neoplasm related pain (acute) (chronic): Secondary | ICD-10-CM | POA: Diagnosis not present

## 2020-10-10 DIAGNOSIS — C801 Malignant (primary) neoplasm, unspecified: Secondary | ICD-10-CM | POA: Diagnosis not present

## 2020-10-10 NOTE — Progress Notes (Signed)
PICC line removed today, 30 min observation, dressing clean, dry, and intact, pt VSS, no complaints per pt. Pt discharged stable and ambulatory to lobby.

## 2020-10-10 NOTE — Progress Notes (Signed)
Gynecologic Oncology Multi-Disciplinary Disposition Conference Note  Date of the Conference: 10/10/2020  Patient Name: Sandra Hunt [facility]  Primary GYN Oncologist: Dr. 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 Disposition is to chemotherapy with carboplatin and paclitaxel and consideration for pembrolizumab in the future.   This Multidisciplinary conference took place involving physicians from [place], [place], Radiation Oncology, Pathology, Radiology along with the Gynecologic Oncology Nurse Practitioner and RN.  Comprehensive assessment of the patient's malignancy, staging, need for surgery, chemotherapy, radiation therapy, and need for further testing were reviewed. Supportive measures, both inpatient and following discharge were also discussed. The recommended plan of care is documented. Greater than 35 minutes were spent correlating and coordinating this patient's care.

## 2020-10-10 NOTE — Telephone Encounter (Signed)
Called pt to see how she did with her recent treatment.  Left message to call back to let us know how she did.

## 2020-10-10 NOTE — Patient Instructions (Signed)
PICC Removal, Adult, Care After This sheet gives you information about how to care for yourself after your procedure. Your health care provider may also give you more specific instructions. If you have problems or questions, contact your health care provider. What can I expect after the procedure? After your procedure, it is common to have:  Tenderness or soreness.  Redness, swelling, or a scab where the PICC was removed (exit site). Follow these instructions at home: For the first 24 hours after the procedure  Keep the bandage (dressing) on the exit site clean and dry. Do not remove the dressing until your health care provider tells you to do so.  Check your arm often for signs and symptoms of an infection. Check for: ? A red streak that spreads away from the dressing. ? Blood or fluid that you can see on the dressing. ? More redness or swelling.  Do not lift anything heavy or do activities that require great effort until your health care provider says it is okay. You should avoid: ? Lifting weights. ? Yard work. ? Any physical activity with repetitive arm movement.  Watch closely for any signs of an air bubble in the vein (air embolism). This is a rare but serious complication. If you have signs of air embolism, call 911 immediately and lie down on your left side to keep the air from moving into the lungs. Signs of an air embolism include: ? Difficulty breathing. ? Chest pain. ? Coughing or wheezing. ? Skin that is pale, blue, cold, or clammy. ? Rapid pulse. ? Rapid breathing. ? Fainting. After 24 Hours have passed:  Remove your dressing as told by your health care provider. Make sure you wash your hands with soap and water before and after you change the dressing. If soap and water are not available, use hand sanitizer.  Return to your normal activities as told by your health care provider.  A small scab may develop over the exit site. Do not pick at the scab.  When bathing  or showering, gently wash the exit site with soap and water. Pat it dry.  Watch for signs of infection, such as: ? Fever or chills. ? Swollen glands under the arm. ? More redness, swelling, or soreness in the arm. ? Blood, fluid, or pus coming from the exit site. ? Warmth or a bad smell at the exit site. ? A red streak spreading away from the exit site.   General instructions  Take over-the-counter and prescription medicines only as told by your health care provider. Do not take any new medicines without checking with your health care provider first.  If you were prescribed an antibiotic medicine, apply or take it as told by your health care provider. Do not stop using the antibiotic even if your condition improves.  Keep all follow-up visits as told by your health care provider. This is important. Contact a health care provider if:  You have a fever or chills.  You have soreness, redness, or swelling on your exit site, and it gets worse.  You have swollen glands under your arm.  You have any of the following symptoms at your exit site: ? Blood, fluid, or pus. ? Unusual warmth. ? A bad smell. ? A red streak spreading away from the exit site. Get help right away if:  You have numbness or tingling in your fingers, hand, or arm.  Your arm looks blue and feels cold.  You have signs of an air embolism,  such as: ? Difficulty breathing. ? Chest pain. ? Coughing or wheezing. ? Skin that is pale, blue, cold, or clammy. ? Rapid pulse. ? Rapid breathing. ? Fainting. These symptoms may represent a serious problem that is an emergency. Do not wait to see if the symptoms will go away. Get medical help right away. Call your local emergency services (911 in the [place] Do not drive yourself to the hospital. Summary  After your procedure, it is common to have tenderness or soreness, redness, swelling, or a scab at the exit site.  Keep the dressing over the exit site clean and dry. Do  not remove the dressing until your health care provider tells you to do so.  Do not lift anything heavy or do activities that require great effort until your health care provider says it is okay.  Watch closely for any signs of an air embolism. If you have signs of air embolism, call 911 immediately and lie down on your left side. This information is not intended to replace advice given to you by your health care provider. Make sure you discuss any questions you have with your health care provider. Document Revised: 01/13/2020 Document Reviewed: 01/13/2020 [employer] Patient Education  2021 [employer].

## 2020-10-10 NOTE — Telephone Encounter (Signed)
-----   Message from Isabel Caprice, RN sent at 10/07/2020  4:17 PM EST ----- Regarding: First time Dr. Alvy Bimler patient. Please follow up with patient, first time taxol/carbo on 10/07/20. Thank you!!

## 2020-10-10 NOTE — Progress Notes (Signed)
PICC line removal per Dr. Alvy Bimler.  Site clean, dry, and intact.  Site covered with vaseline gauze, 4X4, and hypafix. 39 cm catheter observed.   Pt laid flat for 30 minutes post procedure.  Tolerated well.

## 2020-10-11 ENCOUNTER — Encounter: Payer: Self-pay | Admitting: Hematology and Oncology

## 2020-10-11 NOTE — Progress Notes (Signed)
[facility] OFF[facility] PROGRESS NOTE  Patient Care Team: Donald Prose, MD as PCP - General (Family Medicine)  ASSESSMENT & PLAN:  Endometrial cancer [facility]) She has received 1 cycle of chemotherapy last week Overall, her symptoms has improved She is able to eat and drink Her pain is reasonably controlled She has regular bowel movement Unfortunately, despite significant education provided to her last week, the patient did not flush her PICC line over the weekend At this point in time, I recommend PICC line removal because it is not likely we can restore flow without regular flushes over the weekend She is in agreement I plan to continue aggressive supportive care given her frail status and difficulties with patient understanding of the whole disease process and symptom management I have set up virtual visit at the end of the week to follow  S/P PICC central line placement The PICC line is not working well We will get it removed  Cancer associated pain Her pain is well controlled We discussed narcotic refill policy  Nausea without vomiting This is subsided She has antiemetics to take as needed We discussed the importance of regular bowel movement   No orders of the defined types were placed in this encounter.   All questions were answered. The patient knows to call the clinic with any problems, questions or concerns. The total time spent in the appointment was 25 minutes encounter with patients including review of chart and various tests results, discussions about plan of care and coordination of care plan   Heath Lark, MD 10/11/2020 8:28 AM  INTERVAL HISTORY: Please see below for problem oriented charting. She returns for further follow-up She has been feeling good since chemotherapy No recent significant vomiting She had regular bowel movement Her pain is well controlled Despite significant teaching last week, the patient told me she did not know how to flush  her PICC line and did not pick up any supplies to flush her PICC line over the weekend  SUMMARY OF ONCOLOGIC HISTORY: Oncology History  Endometrial cancer ([place])  08/17/2019 Initial Diagnosis   Endometrial cancer ([place]) The patient reported having menstrual-like cyclical bleeding for approximately 2 years beginning in 2018.  In April 2020 she then began passing heavy clots for approximately 3 months.  With subsequent lighter vaginal persistent daily bleeding.  She was eventually seen by Dr. Nelda Marseille who performed a transvaginal ultrasound scan on July 30, 2019 which revealed a uterus measuring 11.6 x 7.2 x 6.7 cm.  The endometrium was 6 mm in thickness.  The right and left ovaries were grossly normal.   Due to the endometrial thickening she underwent an office transvaginal biopsy on August 06, 2019.  This revealed FIGO grade 2 endometrioid adenocarcinoma.   09/28/2019 Surgery   Surgeon: Donaciano Eva    Assistants: Joylene John, NP (a provider assistant was necessary for tissue manipulation, management of robotic instrumentation, retraction and positioning due to the complexity of the case and hospital policies).    Anesthesia: General endotracheal anesthesia    Operation: Robotic-assisted laparoscopic total hysterectomy >250gm with bilateral salpingoophorectomy, SLN biopsy  Operative Findings:  12cm bulky uterus without gross extrauterine disease. Bulky lower uterine segment and endocervix, but no gross cervical disease, no suspicious nodes. Uterus weighed in OR - 381gm.     04/07/2020 Imaging   Intramammary lymph node in the LEFT breast accounts for the mammographic abnormality. No mammographic or ultrasound evidence for malignancy.   09/21/2020 Imaging   1. Findings of diffuse  peritoneal carcinomatosis, with serosal implants along the liver, omental caking, and multiple mesenteric soft tissue nodules as above. Findings are compatible with metastatic endometrial cancer. 2. Abdominal  and pelvic ascites. 3.  Aortic Atherosclerosis (ICD10-I70.0).   09/27/2020 Pathology Results   A. LYMPH NODE, LEFT OBTURATOR, SENTINEL, BIOPSY:  - Lymph node, negative for carcinoma (0/1).  See comment   B. LYMPH NODE, RIGHT DEEP OBTURATOR, SENTINEL, BIOPSY:  - Lymph node, negative for carcinoma (0/1).  See comment   C. UTERUS, CERVIX, BILATERAL OVARIES AND FALLOPIAN TUBES:  - Endometrioid adenocarcinoma, FIGO grade 1, 12 cm, involving anterior and posterior endometrium in a carpet-like fashion  - Carcinoma invades into the myometrium for an approximate depth of 0.4 cm where the myometrial thickness is about 2.5 cm  - Lymphovascular invasion is present  - Adenomyosis  - Unremarkable cervix  - Unremarkable bilateral fallopian tubes and ovaries  - See oncology table   COMMENT:   A and B.   Immunohistochemical stain for pancytokeratin (AE1/AE3) is negative for metastatic carcinoma.   ONCOLOGY TABLE:   UTERUS, CARCINOMA OR CARCINOSARCOMA   Procedure: Total hysterectomy and bilateral salpingo-oophorectomy  Histologic type: Endometrioid adenocarcinoma  Histologic Grade: FIGO grade 1  Myometrial invasion:       Depth of invasion: 4 mm       Myometrial thickness: 25 mm  Uterine Serosa Involvement: Not identified  Cervical stromal involvement: Not identified  Extent of involvement of other organs: Not identified  Lymphovascular invasion: Present  Regional Lymph Nodes:       Examined:     2 Sentinel                               0 Non-sentinel                               2 Total        Lymph nodes with metastasis: 0        Isolated tumor cells (<0.2 mm): 0        Micrometastasis: (>0.2 mm and < 2.0 mm): 0        Macrometastasis: (>2.0 mm): 0        Extracapsular extension: Not applicable  Representative Tumor Block: C8  MMR / MSI testing: Will be ordered  Pathologic Stage Classification (pTNM, AJCC 8th edition):  pT1a, pN0  FIGO Stage (2015 FIGO Cancer Report):  IA     10/04/2020 Cancer Staging   Staging form: Corpus Uteri - Carcinoma and Carcinosarcoma, AJCC 8th Edition - Pathologic stage from 10/04/2020: FIGO Stage IA (pT1a, pN0, cM0) - Signed by Heath Lark, MD on 10/04/2020   10/04/2020 Cancer Staging   Staging form: Corpus Uteri - Carcinoma and Carcinosarcoma, AJCC 8th Edition - Clinical stage from 10/04/2020: FIGO Stage IVB (rcT3, cN0, cM1) - Signed by Heath Lark, MD on 10/04/2020   10/05/2020 Procedure   Successful ultrasound and fluoroscopic guided placement of a right basilic vein approach, 39 cm, 5 French, single lumen PICC with tip at the superior caval-atrial junction. The PICC line is ready for immediate use.   10/05/2020 Procedure   1. Technically successful CT guided core needle biopsy of omental caking within the ventral aspect of mid abdomen. 2. Technically successful image guided paracentesis yielding 2.6 L serous fluid. A representative sample of aspirated fluid was capped sent to the laboratory for cytologic analysis.  REVIEW OF SYSTEMS:   Constitutional: Denies fevers, chills or abnormal weight loss Eyes: Denies blurriness of vision Ears, nose, mouth, throat, and face: Denies mucositis or sore throat Respiratory: Denies cough, dyspnea or wheezes Cardiovascular: Denies palpitation, chest discomfort or lower extremity swelling Gastrointestinal:  Denies nausea, heartburn or change in bowel habits Skin: Denies abnormal skin rashes Lymphatics: Denies new lymphadenopathy or easy bruising Neurological:Denies numbness, tingling or new weaknesses Behavioral/Psych: Mood is stable, no new changes  All other systems were reviewed with the patient and are negative.  I have reviewed the past medical history, past surgical history, social history and family history with the patient and they are unchanged from previous note.  ALLERGIES:  is allergic to other and sulfacetamide sodium-sulfur.  MEDICATIONS:  Current Outpatient Medications   Medication Sig Dispense Refill  . dexamethasone (DECADRON) 4 MG tablet Take 2 tabs at the night before and 2 tab the morning of chemotherapy, every 3 weeks, by mouth x 6 cycles 36 tablet 6  . docusate sodium (COLACE) 100 MG capsule Take 100 mg by mouth daily.    . Lidocaine-Menthol (ICY HOT LIDOCAINE PLUS MENTHOL EX) Apply 1 application topically daily as needed (pain).    . montelukast (SINGULAIR) 10 MG tablet Take 10 mg by mouth daily.    . ondansetron (ZOFRAN ODT) 4 MG disintegrating tablet Take 1 tablet (4 mg total) by mouth every 8 (eight) hours as needed for nausea or vomiting. (Patient not taking: No sig reported) 20 tablet 0  . oxyCODONE (ROXICODONE) 15 MG immediate release tablet Take 1 tablet (15 mg total) by mouth every 4 (four) hours as needed for severe pain. 60 tablet 0  . PROAIR RESPICLICK 093 (90 Base) MCG/ACT AEPB Inhale 1 puff into the lungs every 4 (four) hours as needed (wheezing/shortness of breath).     . promethazine (PHENERGAN) 25 MG tablet Take 1 tablet (25 mg total) by mouth every 6 (six) hours as needed for nausea or vomiting. 60 tablet 1  . Sodium Chloride Flush (NORMAL SALINE FLUSH) 0.9 % SOLN Inject 10 mLs into the vein daily. 300 mL 11   No current facility-administered medications for this visit.    PHYSICAL EXAMINATION: ECOG PERFORMANCE STATUS: 1 - Symptomatic but completely ambulatory  Vitals:   10/10/20 1119  BP: 111/75  Pulse: (!) 125  Resp: 20  Temp: 98.2 F (36.8 C)  SpO2: 95%   Filed Weights   10/10/20 1119  Weight: 238 lb 9.6 oz (108.2 kg)    GENERAL:alert, no distress and comfortable SKIN: skin color, texture, turgor are normal, no rashes or significant lesions EYES: normal, Conjunctiva are pink and non-injected, sclera clear OROPHARYNX:no exudate, no erythema and lips, buccal mucosa, and tongue normal  NECK: supple, thyroid normal size, non-tender, without nodularity LYMPH:  no palpable lymphadenopathy in the cervical, axillary or  inguinal LUNGS: clear to auscultation and percussion with normal breathing effort HEART: regular rate & rhythm and no murmurs and no lower extremity edema ABDOMEN:abdomen soft, persistent distention Musculoskeletal:no cyanosis of digits and no clubbing  NEURO: alert & oriented x 3 with fluent speech, no focal motor/sensory deficits  LABORATORY DATA:  I have reviewed the data as listed    Component Value Date/Time   NA 142 10/04/2020 1315   K 4.0 10/04/2020 1315   CL 106 10/04/2020 1315   CO2 21 (L) 10/04/2020 1315   GLUCOSE 127 (H) 10/04/2020 1315   BUN 21 10/04/2020 1315   CREATININE 1.04 (H) 10/04/2020 1315   CALCIUM  9.5 10/04/2020 1315   PROT 8.2 (H) 10/04/2020 1315   ALBUMIN 2.8 (L) 10/04/2020 1315   AST 56 (H) 10/04/2020 1315   ALT 14 10/04/2020 1315   ALKPHOS 134 (H) 10/04/2020 1315   BILITOT 0.3 10/04/2020 1315   GFRNONAA >60 10/04/2020 1315   GFRAA >60 09/22/2019 1008    No results found for: SPEP, UPEP  Lab Results  Component Value Date   WBC 7.6 10/04/2020   NEUTROABS 6.1 10/04/2020   HGB 13.4 10/04/2020   HCT 41.6 10/04/2020   MCV 88.5 10/04/2020   PLT 435 (H) 10/04/2020      Chemistry      Component Value Date/Time   NA 142 10/04/2020 1315   K 4.0 10/04/2020 1315   CL 106 10/04/2020 1315   CO2 21 (L) 10/04/2020 1315   BUN 21 10/04/2020 1315   CREATININE 1.04 (H) 10/04/2020 1315      Component Value Date/Time   CALCIUM 9.5 10/04/2020 1315   ALKPHOS 134 (H) 10/04/2020 1315   AST 56 (H) 10/04/2020 1315   ALT 14 10/04/2020 1315   BILITOT 0.3 10/04/2020 1315       RADIOGRAPHIC STUDIES: I have personally reviewed the radiological images as listed and agreed with the findings in the report. CT ABDOMEN PELVIS W CONTRAST  Result Date: 09/21/2020 CLINICAL DATA:  Abdominal distension for 1 month, history of endometrial cancer EXAM: CT ABDOMEN AND PELVIS WITH CONTRAST TECHNIQUE: Multidetector CT imaging of the abdomen and pelvis was performed using the  standard protocol following bolus administration of intravenous contrast. CONTRAST:  17m OMNIPAQUE IOHEXOL 300 MG/ML  SOLN COMPARISON:  07/30/2019 FINDINGS: Lower chest: No acute pleural or parenchymal lung disease. Hepatobiliary: Numerous serosal implants are seen along the liver, consistent with metastatic disease in a patient with a history of endometrial cancer. Largest of these inferior right lobe liver measuring 3.3 x 2.9 cm. There are no focal liver parenchymal abnormalities. The gallbladder is unremarkable. Pancreas: Unremarkable. No pancreatic ductal dilatation or surrounding inflammatory changes. Spleen: Normal in size without focal abnormality. Adrenals/Urinary Tract: Adrenal glands are unremarkable. Kidneys are normal, without renal calculi, focal lesion, or hydronephrosis. Bladder is unremarkable. Stomach/Bowel: No bowel obstruction or ileus. The appendix is surgically absent. No bowel wall thickening or inflammatory change. Vascular/Lymphatic: Minimal atherosclerosis of the aorta. No discrete adenopathy. Reproductive: Uterus and adnexal structures are surgically absent. Other: Soft tissue masses are seen throughout the mesentery, consistent with metastatic disease and peritoneal carcinomatosis. Omental caking in the ventral abdomen measures up to 5.1 cm in thickness. Soft tissue nodules are seen within the lower pelvis as well. There is free fluid throughout the abdomen and pelvis. No free intraperitoneal gas. No abdominal wall hernia. Musculoskeletal: No acute or destructive bony lesions. 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 Reconstructed images demonstrate no additional findings. IMPRESSION: 1. Findings of diffuse peritoneal carcinomatosis, with serosal implants along the liver, omental caking, and multiple mesenteric soft tissue nodules as above. Findings are compatible with metastatic endometrial cancer. 2. Abdominal and pelvic ascites. 3.  Aortic Atherosclerosis  (ICD10-I70.0). Electronically Signed   By: MRanda NgoM.D.   On: 09/21/2020 02:28   CT BIOPSY  Result Date: 10/05/2020 INDICATION: History of endometrial cancer, now with omental caking and intra-abdominal ascites worrisome for metastatic disease. Please perform omental cake biopsy and image guided paracentesis for diagnostic and therapeutic purposes. EXAM: 1. CT-GUIDED OMENTAL CAKING BIOPSY 2. ULTRASOUND-GUIDED PARACENTESIS COMPARISON:  CT abdomen pelvis-09/21/2020 MEDICATIONS: None. ANESTHESIA/SEDATION: Fentanyl 25  mcg IV; Versed 1.5 mg IV Sedation time: 28 minutes; The patient was continuously monitored during the procedure by the interventional radiology nurse under my direct supervision. CONTRAST:  None. COMPLICATIONS: None immediate. PROCEDURE: Informed consent was obtained from the patient following an explanation of the procedure, risks, benefits and alternatives. A time out was performed prior to the initiation of the procedure. The patient was positioned supine on the CT table and a limited CT was performed for procedural planning demonstrating omental caking involving the ventral aspect of the mid abdomen with dominant component measuring approximately 16.2 x 5.3 cm (image 48, series 2). Sonographic evaluation was then performed of the right mid hemiabdomen demonstrated an adequate percutaneous window to the moderate volume intra-abdominal ascites The procedures were planned. The operative sites were prepped and draped in the usual sterile fashion. Under direct ultrasound guidance, pocket of fluid within the right lateral aspect of the mid abdomen was accessed with a Safe-T-Centesis catheter. Ultrasound image was saved for procedural documentation purposes. Next, the paracentesis performed ultimately yielding 2.6 L of serous fluid. A representative sample was capped and sent to the laboratory for cytologic analysis. Attention was now paid towards the omental mass biopsy. Appropriate trajectory was  confirmed with a 22 gauge spinal needle after the adjacent tissues were anesthetized with 1% Lidocaine with epinephrine. Under intermittent CT guidance, a 17 gauge coaxial needle was advanced into the peripheral aspect of the omental mass. Appropriate positioning was confirmed and 8 core needle biopsy samples were obtained with an 18 gauge core needle biopsy device. The co-axial needle was removed following administration of a Gel-Foam slurry and superficial hemostasis was achieved with manual compression. A limited postprocedural CT demonstrated significant reduction/near resolution of intra-abdominal ascites and was negative for hemorrhage or additional complication. A dressing was placed. The patient tolerated the procedure well without immediate postprocedural complication. IMPRESSION: 1. Technically successful CT guided core needle biopsy of omental caking within the ventral aspect of mid abdomen. 2. Technically successful image guided paracentesis yielding 2.6 L serous fluid. A representative sample of aspirated fluid was capped sent to the laboratory for cytologic analysis. Electronically Signed   By: Sandi Mariscal M.D.   On: 10/05/2020 13:21   IR PICC PLACEMENT RIGHT >5 YRS INC IMG GUIDE  Result Date: 10/05/2020 INDICATION: Patient with a history of endometrial cancer now with peritoneal carcinomatosis. Interventional radiology asked to place a PICC for chemotherapy. EXAM: ULTRASOUND AND FLUOROSCOPIC GUIDED PICC LINE INSERTION MEDICATIONS: 1% lidocaine 2 mL CONTRAST:  None FLUOROSCOPY TIME:  24 seconds (3 mGy) COMPLICATIONS: None immediate. TECHNIQUE: The procedure, risks, benefits, and alternatives were explained to the patient and informed written consent was obtained. The right upper extremity was prepped with chlorhexidine in a sterile fashion, and a sterile drape was applied covering the operative field. Maximum barrier sterile technique with sterile gowns and gloves were used for the procedure. A  timeout was performed prior to the initiation of the procedure. Local anesthesia was provided with 1% lidocaine. After the overlying soft tissues were anesthetized with 1% lidocaine, a micropuncture kit was utilized to access the right basilic vein. Real-time ultrasound guidance was utilized for vascular access including the acquisition of a permanent ultrasound image documenting patency of the accessed vessel. A guidewire was advanced to the level of the superior caval-atrial junction for measurement purposes and the PICC line was cut to length. A peel-away sheath was placed and a 39 cm, 5 [place], single lumen was inserted to level of the superior caval-atrial junction.  A post procedure spot fluoroscopic was obtained. The catheter easily aspirated and flushed and was secured in place. A dressing was placed. The patient tolerated the procedure well without immediate post procedural complication. FINDINGS: After catheter placement, the tip lies within the superior cavoatrial junction. The catheter aspirates and flushes normally and is ready for immediate use. IMPRESSION: Successful ultrasound and fluoroscopic guided placement of a right basilic vein approach, 39 cm, 5 French, single lumen PICC with tip at the superior caval-atrial junction. The PICC line is ready for immediate use. Read by: Soyla Dryer, NP Electronically Signed   By: Sandi Mariscal M.D.   On: 10/05/2020 10:45   IR Paracentesis  Result Date: 10/05/2020 INDICATION: History of endometrial cancer, now with omental caking and intra-abdominal ascites worrisome for metastatic disease. Please perform omental cake biopsy and image guided paracentesis for diagnostic and therapeutic purposes. EXAM: 1. CT-GUIDED OMENTAL CAKING BIOPSY 2. ULTRASOUND-GUIDED PARACENTESIS COMPARISON:  CT abdomen pelvis-09/21/2020 MEDICATIONS: None. ANESTHESIA/SEDATION: Fentanyl 25 mcg IV; Versed 1.5 mg IV Sedation time: 28 minutes; The patient was continuously monitored during  the procedure by the interventional radiology nurse under my direct supervision. CONTRAST:  None. COMPLICATIONS: None immediate. PROCEDURE: Informed consent was obtained from the patient following an explanation of the procedure, risks, benefits and alternatives. A time out was performed prior to the initiation of the procedure. The patient was positioned supine on the CT table and a limited CT was performed for procedural planning demonstrating omental caking involving the ventral aspect of the mid abdomen with dominant component measuring approximately 16.2 x 5.3 cm (image 48, series 2). Sonographic evaluation was then performed of the right mid hemiabdomen demonstrated an adequate percutaneous window to the moderate volume intra-abdominal ascites The procedures were planned. The operative sites were prepped and draped in the usual sterile fashion. Under direct ultrasound guidance, pocket of fluid within the right lateral aspect of the mid abdomen was accessed with a Safe-T-Centesis catheter. Ultrasound image was saved for procedural documentation purposes. Next, the paracentesis performed ultimately yielding 2.6 L of serous fluid. A representative sample was capped and sent to the laboratory for cytologic analysis. Attention was now paid towards the omental mass biopsy. Appropriate trajectory was confirmed with a 22 gauge spinal needle after the adjacent tissues were anesthetized with 1% Lidocaine with epinephrine. Under intermittent CT guidance, a 17 gauge coaxial needle was advanced into the peripheral aspect of the omental mass. Appropriate positioning was confirmed and 8 core needle biopsy samples were obtained with an 18 gauge core needle biopsy device. The co-axial needle was removed following administration of a Gel-Foam slurry and superficial hemostasis was achieved with manual compression. A limited postprocedural CT demonstrated significant reduction/near resolution of intra-abdominal ascites and was  negative for hemorrhage or additional complication. A dressing was placed. The patient tolerated the procedure well without immediate postprocedural complication. IMPRESSION: 1. Technically successful CT guided core needle biopsy of omental caking within the ventral aspect of mid abdomen. 2. Technically successful image guided paracentesis yielding 2.6 L serous fluid. A representative sample of aspirated fluid was capped sent to the laboratory for cytologic analysis. Electronically Signed   By: Sandi Mariscal M.D.   On: 10/05/2020 13:21

## 2020-10-11 NOTE — Assessment & Plan Note (Signed)
She has received 1 cycle of chemotherapy last week Overall, her symptoms has improved She is able to eat and drink Her pain is reasonably controlled She has regular bowel movement Unfortunately, despite significant education provided to her last week, the patient did not flush her PICC line over the weekend At this point in time, I recommend PICC line removal because it is not likely we can restore flow without regular flushes over the weekend She is in agreement I plan to continue aggressive supportive care given her frail status and difficulties with patient understanding of the whole disease process and symptom management I have set up virtual visit at the end of the week to follow

## 2020-10-11 NOTE — Assessment & Plan Note (Signed)
Her pain is well controlled We discussed narcotic refill policy 

## 2020-10-11 NOTE — Assessment & Plan Note (Signed)
The PICC line is not working well We will get it removed

## 2020-10-11 NOTE — Assessment & Plan Note (Signed)
This is subsided She has antiemetics to take as needed We discussed the importance of regular bowel movement

## 2020-10-14 ENCOUNTER — Other Ambulatory Visit: Payer: Self-pay

## 2020-10-14 ENCOUNTER — Inpatient Hospital Stay (HOSPITAL_BASED_OUTPATIENT_CLINIC_OR_DEPARTMENT_OTHER): Payer: Federal, State, Local not specified - PPO | Admitting: Hematology and Oncology

## 2020-10-14 ENCOUNTER — Encounter: Payer: Self-pay | Admitting: Hematology and Oncology

## 2020-10-14 DIAGNOSIS — R110 Nausea: Secondary | ICD-10-CM | POA: Diagnosis not present

## 2020-10-14 DIAGNOSIS — G893 Neoplasm related pain (acute) (chronic): Secondary | ICD-10-CM | POA: Diagnosis not present

## 2020-10-14 DIAGNOSIS — C541 Malignant neoplasm of endometrium: Secondary | ICD-10-CM | POA: Diagnosis not present

## 2020-10-14 DIAGNOSIS — K5909 Other constipation: Secondary | ICD-10-CM

## 2020-10-14 NOTE — Progress Notes (Signed)
HEMATOLOGY-ONCOLOGY ELECTRONIC VISIT PROGRESS NOTE  Patient Care Team: Donald Prose, MD as PCP - General (Family Medicine)  I connected with  the patient by telephone  ASSESSMENT & PLAN:  Endometrial cancer ([place]andra Hunt) Overall, she tolerated cycle one of chemotherapy very well Her PICC line was safely removed Her next treatment will follow on February 11 I will schedule port placement before her next treatment Continue supportive care  Cancer associated pain Her pain is better controlled She is taking less pain medicine We discussed narcotic refill policy We will call her again next week for follow-up on pain management  Nausea without vomiting Her nausea is improved She will continue antiemetics  Other constipation We discussed the importance of laxative therapy   Orders Placed This Encounter  Procedures  . IR IMAGING GUIDED PORT INSERTION    Standing Status:   Future    Standing Expiration Date:   10/14/2021    Order Specific Question:   Reason for Exam (SYMPTOM  OR DIAGNOSIS REQUIRED)    Answer:   need port for chemo on 2/11    Order Specific Question:   Preferred Imaging Location?    Answer:   [facility]    INTERVAL HISTORY: Please see below for problem oriented charting. The purpose of today's visit is to evaluate for response to therapy and symptom management Since last time I saw her, her PICC line was safely removed without problems She felt better in terms of pain management She is taking only about 3 pills a day for pain Her nausea is greatly improved She is able to eat and drink without difficulties She denies recent constipation No side effects from chemotherapy so far  SUMMARY OF ONCOLOGIC HISTORY: Oncology History  Endometrial cancer ([place])  08/17/2019 Initial Diagnosis   Endometrial cancer ([place]) The patient reported having menstrual-like cyclical bleeding for approximately 2 years beginning in 2018.  In April 2020 she then began passing heavy  clots for approximately 3 months.  With subsequent lighter vaginal persistent daily bleeding.  She was eventually seen by Dr. Nelda Marseille who performed a transvaginal ultrasound scan on July 30, 2019 which revealed a uterus measuring 11.6 x 7.2 x 6.7 cm.  The endometrium was 6 mm in thickness.  The right and left ovaries were grossly normal.   Due to the endometrial thickening she underwent an office transvaginal biopsy on August 06, 2019.  This revealed FIGO grade 2 endometrioid adenocarcinoma.   09/28/2019 Surgery   Surgeon: Donaciano Eva    Assistants: Joylene John, NP (a provider assistant was necessary for tissue manipulation, management of robotic instrumentation, retraction and positioning due to the complexity of the case and hospital policies).    Anesthesia: General endotracheal anesthesia    Operation: Robotic-assisted laparoscopic total hysterectomy >250gm with bilateral salpingoophorectomy, SLN biopsy  Operative Findings:  12cm bulky uterus without gross extrauterine disease. Bulky lower uterine segment and endocervix, but no gross cervical disease, no suspicious nodes. Uterus weighed in OR - 381gm.     04/07/2020 Imaging   Intramammary lymph node in the LEFT breast accounts for the mammographic abnormality. No mammographic or ultrasound evidence for malignancy.   09/21/2020 Imaging   1. Findings of diffuse peritoneal carcinomatosis, with serosal implants along the liver, omental caking, and multiple mesenteric soft tissue nodules as above. Findings are compatible with metastatic endometrial cancer. 2. Abdominal and pelvic ascites. 3.  Aortic Atherosclerosis (ICD10-I70.0).   09/27/2020 Pathology Results   A. LYMPH NODE, LEFT OBTURATOR, SENTINEL, BIOPSY:  - Lymph  node, negative for carcinoma (0/1).  See comment   B. LYMPH NODE, RIGHT DEEP OBTURATOR, SENTINEL, BIOPSY:  - Lymph node, negative for carcinoma (0/1).  See comment   C. UTERUS, CERVIX, BILATERAL OVARIES AND  FALLOPIAN TUBES:  - Endometrioid adenocarcinoma, FIGO grade 1, 12 cm, involving anterior and posterior endometrium in a carpet-like fashion  - Carcinoma invades into the myometrium for an approximate depth of 0.4 cm where the myometrial thickness is about 2.5 cm  - Lymphovascular invasion is present  - Adenomyosis  - Unremarkable cervix  - Unremarkable bilateral fallopian tubes and ovaries  - See oncology table   COMMENT:   A and B.   Immunohistochemical stain for pancytokeratin (AE1/AE3) is negative for metastatic carcinoma.   ONCOLOGY TABLE:   UTERUS, CARCINOMA OR CARCINOSARCOMA   Procedure: Total hysterectomy and bilateral salpingo-oophorectomy  Histologic type: Endometrioid adenocarcinoma  Histologic Grade: FIGO grade 1  Myometrial invasion:       Depth of invasion: 4 mm       Myometrial thickness: 25 mm  Uterine Serosa Involvement: Not identified  Cervical stromal involvement: Not identified  Extent of involvement of other organs: Not identified  Lymphovascular invasion: Present  Regional Lymph Nodes:       Examined:     2 Sentinel                               0 Non-sentinel                               2 Total        Lymph nodes with metastasis: 0        Isolated tumor cells (<0.2 mm): 0        Micrometastasis: (>0.2 mm and < 2.0 mm): 0        Macrometastasis: (>2.0 mm): 0        Extracapsular extension: Not applicable  Representative Tumor Block: C8  MMR / MSI testing: Will be ordered  Pathologic Stage Classification (pTNM, AJCC 8th edition):  pT1a, pN0  FIGO Stage (2015 FIGO Cancer Report):  IA    10/04/2020 Cancer Staging   Staging form: Corpus Uteri - Carcinoma and Carcinosarcoma, AJCC 8th Edition - Pathologic stage from 10/04/2020: FIGO Stage IA (pT1a, pN0, cM0) - Signed by Heath Lark, MD on 10/04/2020   10/04/2020 Cancer Staging   Staging form: Corpus Uteri - Carcinoma and Carcinosarcoma, AJCC 8th Edition - Clinical stage from 10/04/2020: FIGO Stage IVB (rcT3,  cN0, cM1) - Signed by Heath Lark, MD on 10/04/2020   10/05/2020 Procedure   Successful ultrasound and fluoroscopic guided placement of a right basilic vein approach, 39 cm, 5 French, single lumen PICC with tip at the superior caval-atrial junction. The PICC line is ready for immediate use.   10/05/2020 Procedure   1. Technically successful CT guided core needle biopsy of omental caking within the ventral aspect of mid abdomen. 2. Technically successful image guided paracentesis yielding 2.6 L serous fluid. A representative sample of aspirated fluid was capped sent to the laboratory for cytologic analysis.     REVIEW OF SYSTEMS:   Constitutional: Denies fevers, chills or abnormal weight loss Eyes: Denies blurriness of vision Ears, nose, mouth, throat, and face: Denies mucositis or sore throat Respiratory: Denies cough, dyspnea or wheezes Skin: Denies abnormal skin rashes Lymphatics: Denies new lymphadenopathy or easy bruising Neurological:Denies numbness, tingling  or new weaknesses Behavioral/Psych: Mood is stable, no new changes  Extremities: No lower extremity edema All other systems were reviewed with the patient and are negative.  I have reviewed the past medical history, past surgical history, social history and family history with the patient and they are unchanged from previous note.  ALLERGIES:  is allergic to other and sulfacetamide sodium-sulfur.  MEDICATIONS:  Current Outpatient Medications  Medication Sig Dispense Refill  . dexamethasone (DECADRON) 4 MG tablet Take 2 tabs at the night before and 2 tab the morning of chemotherapy, every 3 weeks, by mouth x 6 cycles 36 tablet 6  . docusate sodium (COLACE) 100 MG capsule Take 100 mg by mouth daily.    . Lidocaine-Menthol (ICY HOT LIDOCAINE PLUS MENTHOL EX) Apply 1 application topically daily as needed (pain).    . montelukast (SINGULAIR) 10 MG tablet Take 10 mg by mouth daily.    Marland Kitchen oxyCODONE (ROXICODONE) 15 MG immediate  release tablet Take 1 tablet (15 mg total) by mouth every 4 (four) hours as needed for severe pain. 60 tablet 0  . PROAIR RESPICLICK 940 (90 Base) MCG/ACT AEPB Inhale 1 puff into the lungs every 4 (four) hours as needed (wheezing/shortness of breath).     . promethazine (PHENERGAN) 25 MG tablet Take 1 tablet (25 mg total) by mouth every 6 (six) hours as needed for nausea or vomiting. 60 tablet 1   No current facility-administered medications for this visit.    PHYSICAL EXAMINATION: ECOG PERFORMANCE STATUS: 1 - Symptomatic but completely ambulatory  LABORATORY DATA:  I have reviewed the data as listed CMP Latest Ref Rng & Units 10/04/2020 09/20/2020 09/22/2019  Glucose 70 - 99 mg/dL 127(H) 145(H) 149(H)  BUN 8 - 23 mg/dL 21 16 17   Creatinine 0.44 - 1.00 mg/dL 1.04(H) 0.85 1.09(H)  Sodium 135 - 145 mmol/L 142 137 139  Potassium 3.5 - 5.1 mmol/L 4.0 5.3(H) 3.9  Chloride 98 - 111 mmol/L 106 99 102  CO2 22 - 32 mmol/L 21(L) 26 24  Calcium 8.9 - 10.3 mg/dL 9.5 9.6 9.5  Total Protein 6.5 - 8.1 g/dL 8.2(H) 8.2(H) 7.8  Total Bilirubin 0.3 - 1.2 mg/dL 0.3 0.5 0.8  Alkaline Phos 38 - 126 U/L 134(H) 263(H) 57  AST 15 - 41 U/L 56(H) 34 38  ALT 0 - 44 U/L 14 28 35    Lab Results  Component Value Date   WBC 7.6 10/04/2020   HGB 13.4 10/04/2020   HCT 41.6 10/04/2020   MCV 88.5 10/04/2020   PLT 435 (H) 10/04/2020   NEUTROABS 6.1 10/04/2020     RADIOGRAPHIC STUDIES: I have personally reviewed the radiological images as listed and agreed with the findings in the report. CT ABDOMEN PELVIS W CONTRAST  Result Date: 09/21/2020 CLINICAL DATA:  Abdominal distension for 1 month, history of endometrial cancer EXAM: CT ABDOMEN AND PELVIS WITH CONTRAST TECHNIQUE: Multidetector CT imaging of the abdomen and pelvis was performed using the standard protocol following bolus administration of intravenous contrast. CONTRAST:  111m OMNIPAQUE IOHEXOL 300 MG/ML  SOLN COMPARISON:  07/30/2019 FINDINGS: Lower chest: No  acute pleural or parenchymal lung disease. Hepatobiliary: Numerous serosal implants are seen along the liver, consistent with metastatic disease in a patient with a history of endometrial cancer. Largest of these inferior right lobe liver measuring 3.3 x 2.9 cm. There are no focal liver parenchymal abnormalities. The gallbladder is unremarkable. Pancreas: Unremarkable. No pancreatic ductal dilatation or surrounding inflammatory changes. Spleen: Normal in size without focal  abnormality. Adrenals/Urinary Tract: Adrenal glands are unremarkable. Kidneys are normal, without renal calculi, focal lesion, or hydronephrosis. Bladder is unremarkable. Stomach/Bowel: No bowel obstruction or ileus. The appendix is surgically absent. No bowel wall thickening or inflammatory change. Vascular/Lymphatic: Minimal atherosclerosis of the aorta. No discrete adenopathy. Reproductive: Uterus and adnexal structures are surgically absent. Other: Soft tissue masses are seen throughout the mesentery, consistent with metastatic disease and peritoneal carcinomatosis. Omental caking in the ventral abdomen measures up to 5.1 cm in thickness. Soft tissue nodules are seen within the lower pelvis as well. There is free fluid throughout the abdomen and pelvis. No free intraperitoneal gas. No abdominal wall hernia. Musculoskeletal: No acute or destructive bony lesions. 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 Reconstructed images demonstrate no additional findings. IMPRESSION: 1. Findings of diffuse peritoneal carcinomatosis, with serosal implants along the liver, omental caking, and multiple mesenteric soft tissue nodules as above. Findings are compatible with metastatic endometrial cancer. 2. Abdominal and pelvic ascites. 3.  Aortic Atherosclerosis (ICD10-I70.0). Electronically Signed   By: Randa Ngo M.D.   On: 09/21/2020 02:28   CT BIOPSY  Result Date: 10/05/2020 INDICATION: History of endometrial cancer, now  with omental caking and intra-abdominal ascites worrisome for metastatic disease. Please perform omental cake biopsy and image guided paracentesis for diagnostic and therapeutic purposes. EXAM: 1. CT-GUIDED OMENTAL CAKING BIOPSY 2. ULTRASOUND-GUIDED PARACENTESIS COMPARISON:  CT abdomen pelvis-09/21/2020 MEDICATIONS: None. ANESTHESIA/SEDATION: Fentanyl 25 mcg IV; Versed 1.5 mg IV Sedation time: 28 minutes; The patient was continuously monitored during the procedure by the interventional radiology nurse under my direct supervision. CONTRAST:  None. COMPLICATIONS: None immediate. PROCEDURE: Informed consent was obtained from the patient following an explanation of the procedure, risks, benefits and alternatives. A time out was performed prior to the initiation of the procedure. The patient was positioned supine on the CT table and a limited CT was performed for procedural planning demonstrating omental caking involving the ventral aspect of the mid abdomen with dominant component measuring approximately 16.2 x 5.3 cm (image 48, series 2). Sonographic evaluation was then performed of the right mid hemiabdomen demonstrated an adequate percutaneous window to the moderate volume intra-abdominal ascites The procedures were planned. The operative sites were prepped and draped in the usual sterile fashion. Under direct ultrasound guidance, pocket of fluid within the right lateral aspect of the mid abdomen was accessed with a Safe-T-Centesis catheter. Ultrasound image was saved for procedural documentation purposes. Next, the paracentesis performed ultimately yielding 2.6 L of serous fluid. A representative sample was capped and sent to the laboratory for cytologic analysis. Attention was now paid towards the omental mass biopsy. Appropriate trajectory was confirmed with a 22 gauge spinal needle after the adjacent tissues were anesthetized with 1% Lidocaine with epinephrine. Under intermittent CT guidance, a 17 gauge coaxial  needle was advanced into the peripheral aspect of the omental mass. Appropriate positioning was confirmed and 8 core needle biopsy samples were obtained with an 18 gauge core needle biopsy device. The co-axial needle was removed following administration of a Gel-Foam slurry and superficial hemostasis was achieved with manual compression. A limited postprocedural CT demonstrated significant reduction/near resolution of intra-abdominal ascites and was negative for hemorrhage or additional complication. A dressing was placed. The patient tolerated the procedure well without immediate postprocedural complication. IMPRESSION: 1. Technically successful CT guided core needle biopsy of omental caking within the ventral aspect of mid abdomen. 2. Technically successful image guided paracentesis yielding 2.6 L serous fluid. A representative sample of  aspirated fluid was capped sent to the laboratory for cytologic analysis. Electronically Signed   By: Sandi Mariscal M.D.   On: 10/05/2020 13:21   IR PICC PLACEMENT RIGHT >5 YRS INC IMG GUIDE  Result Date: 10/05/2020 INDICATION: Patient with a history of endometrial cancer now with peritoneal carcinomatosis. Interventional radiology asked to place a PICC for chemotherapy. EXAM: ULTRASOUND AND FLUOROSCOPIC GUIDED PICC LINE INSERTION MEDICATIONS: 1% lidocaine 2 mL CONTRAST:  None FLUOROSCOPY TIME:  24 seconds (3 mGy) COMPLICATIONS: None immediate. TECHNIQUE: The procedure, risks, benefits, and alternatives were explained to the patient and informed written consent was obtained. The right upper extremity was prepped with chlorhexidine in a sterile fashion, and a sterile drape was applied covering the operative field. Maximum barrier sterile technique with sterile gowns and gloves were used for the procedure. A timeout was performed prior to the initiation of the procedure. Local anesthesia was provided with 1% lidocaine. After the overlying soft tissues were anesthetized with 1%  lidocaine, a micropuncture kit was utilized to access the right basilic vein. Real-time ultrasound guidance was utilized for vascular access including the acquisition of a permanent ultrasound image documenting patency of the accessed vessel. A guidewire was advanced to the level of the superior caval-atrial junction for measurement purposes and the PICC line was cut to length. A peel-away sheath was placed and a 39 cm, 5 [place], single lumen was inserted to level of the superior caval-atrial junction. A post procedure spot fluoroscopic was obtained. The catheter easily aspirated and flushed and was secured in place. A dressing was placed. The patient tolerated the procedure well without immediate post procedural complication. FINDINGS: After catheter placement, the tip lies within the superior cavoatrial junction. The catheter aspirates and flushes normally and is ready for immediate use. IMPRESSION: Successful ultrasound and fluoroscopic guided placement of a right basilic vein approach, 39 cm, 5 French, single lumen PICC with tip at the superior caval-atrial junction. The PICC line is ready for immediate use. Read by: Soyla Dryer, NP Electronically Signed   By: Sandi Mariscal M.D.   On: 10/05/2020 10:45   IR Paracentesis  Result Date: 10/05/2020 INDICATION: History of endometrial cancer, now with omental caking and intra-abdominal ascites worrisome for metastatic disease. Please perform omental cake biopsy and image guided paracentesis for diagnostic and therapeutic purposes. EXAM: 1. CT-GUIDED OMENTAL CAKING BIOPSY 2. ULTRASOUND-GUIDED PARACENTESIS COMPARISON:  CT abdomen pelvis-09/21/2020 MEDICATIONS: None. ANESTHESIA/SEDATION: Fentanyl 25 mcg IV; Versed 1.5 mg IV Sedation time: 28 minutes; The patient was continuously monitored during the procedure by the interventional radiology nurse under my direct supervision. CONTRAST:  None. COMPLICATIONS: None immediate. PROCEDURE: Informed consent was obtained  from the patient following an explanation of the procedure, risks, benefits and alternatives. A time out was performed prior to the initiation of the procedure. The patient was positioned supine on the CT table and a limited CT was performed for procedural planning demonstrating omental caking involving the ventral aspect of the mid abdomen with dominant component measuring approximately 16.2 x 5.3 cm (image 48, series 2). Sonographic evaluation was then performed of the right mid hemiabdomen demonstrated an adequate percutaneous window to the moderate volume intra-abdominal ascites The procedures were planned. The operative sites were prepped and draped in the usual sterile fashion. Under direct ultrasound guidance, pocket of fluid within the right lateral aspect of the mid abdomen was accessed with a Safe-T-Centesis catheter. Ultrasound image was saved for procedural documentation purposes. Next, the paracentesis performed ultimately yielding 2.6 L of serous fluid.  A representative sample was capped and sent to the laboratory for cytologic analysis. Attention was now paid towards the omental mass biopsy. Appropriate trajectory was confirmed with a 22 gauge spinal needle after the adjacent tissues were anesthetized with 1% Lidocaine with epinephrine. Under intermittent CT guidance, a 17 gauge coaxial needle was advanced into the peripheral aspect of the omental mass. Appropriate positioning was confirmed and 8 core needle biopsy samples were obtained with an 18 gauge core needle biopsy device. The co-axial needle was removed following administration of a Gel-Foam slurry and superficial hemostasis was achieved with manual compression. A limited postprocedural CT demonstrated significant reduction/near resolution of intra-abdominal ascites and was negative for hemorrhage or additional complication. A dressing was placed. The patient tolerated the procedure well without immediate postprocedural complication.  IMPRESSION: 1. Technically successful CT guided core needle biopsy of omental caking within the ventral aspect of mid abdomen. 2. Technically successful image guided paracentesis yielding 2.6 L serous fluid. A representative sample of aspirated fluid was capped sent to the laboratory for cytologic analysis. Electronically Signed   By: Sandi Mariscal M.D.   On: 10/05/2020 13:21    I discussed the assessment and treatment plan with the patient. The patient was provided an opportunity to ask questions and all were answered. The patient agreed with the plan and demonstrated an understanding of the instructions. The patient was advised to call back or seek an in-person evaluation if the symptoms worsen or if the condition fails to improve as anticipated.    I spent 20 minutes for the appointment reviewing test results, discuss management and coordination of care.  Heath Lark, MD 10/14/2020 9:54 AM

## 2020-10-14 NOTE — Assessment & Plan Note (Signed)
Her pain is better controlled She is taking less pain medicine We discussed narcotic refill policy We will call her again next week for follow-up on pain management

## 2020-10-14 NOTE — Assessment & Plan Note (Signed)
Overall, she tolerated cycle one of chemotherapy very well Her PICC line was safely removed Her next treatment will follow on February 11 I will schedule port placement before her next treatment Continue supportive care

## 2020-10-14 NOTE — Assessment & Plan Note (Signed)
Her nausea is improved She will continue antiemetics

## 2020-10-14 NOTE — Assessment & Plan Note (Signed)
We discussed the importance of laxative therapy 

## 2020-10-17 ENCOUNTER — Telehealth: Payer: Self-pay

## 2020-10-17 ENCOUNTER — Other Ambulatory Visit: Payer: Self-pay | Admitting: Radiology

## 2020-10-17 NOTE — Telephone Encounter (Signed)
Called and spoke with son, Catalina Antigua. He will give her the message to call. [place] is asleep.

## 2020-10-17 NOTE — Telephone Encounter (Signed)
-----   Message from Heath Lark, MD sent at 10/17/2020  8:20 AM EST ----- Regarding: oxycodone and others Last week, she said she is running low Can you call and ask if she needs refill and how is she doing:?

## 2020-10-17 NOTE — Telephone Encounter (Signed)
She never called back. FYI 

## 2020-10-17 NOTE — Telephone Encounter (Signed)
Called and left below message. Ask her to call the office back. 

## 2020-10-18 ENCOUNTER — Telehealth: Payer: Self-pay | Admitting: Hematology and Oncology

## 2020-10-18 ENCOUNTER — Ambulatory Visit (HOSPITAL_COMMUNITY)
Admission: RE | Admit: 2020-10-18 | Discharge: 2020-10-18 | Disposition: A | Discharge status: Home / Self Care | Payer: Federal, State, Local not specified - PPO | Source: Ambulatory Visit | Attending: Hematology and Oncology | Admitting: Hematology and Oncology

## 2020-10-18 ENCOUNTER — Other Ambulatory Visit: Payer: Self-pay

## 2020-10-18 ENCOUNTER — Encounter (HOSPITAL_COMMUNITY): Payer: Self-pay

## 2020-10-18 DIAGNOSIS — C541 Malignant neoplasm of endometrium: Secondary | ICD-10-CM | POA: Insufficient documentation

## 2020-10-18 DIAGNOSIS — Z882 Allergy status to sulfonamides status: Secondary | ICD-10-CM | POA: Diagnosis not present

## 2020-10-18 DIAGNOSIS — Z452 Encounter for adjustment and management of vascular access device: Secondary | ICD-10-CM | POA: Diagnosis not present

## 2020-10-18 HISTORY — PX: IR IMAGING GUIDED PORT INSERTION: IMG5740

## 2020-10-18 LAB — CBC WITH DIFFERENTIAL/PLATELET
Abs Immature Granulocytes: 0.03 10*3/uL (ref 0.00–0.07)
Basophils Absolute: 0 10*3/uL (ref 0.0–0.1)
Basophils Relative: 1 %
Eosinophils Absolute: 0 10*3/uL (ref 0.0–0.5)
Eosinophils Relative: 1 %
HCT: 35.5 % — ABNORMAL LOW (ref 36.0–46.0)
Hemoglobin: 11.3 g/dL — ABNORMAL LOW (ref 12.0–15.0)
Immature Granulocytes: 2 %
Lymphocytes Relative: 34 %
Lymphs Abs: 0.5 10*3/uL — ABNORMAL LOW (ref 0.7–4.0)
MCH: 28.4 pg (ref 26.0–34.0)
MCHC: 31.8 g/dL (ref 30.0–36.0)
MCV: 89.2 fL (ref 80.0–100.0)
Monocytes Absolute: 0.3 10*3/uL (ref 0.1–1.0)
Monocytes Relative: 20 %
Neutro Abs: 0.6 10*3/uL — ABNORMAL LOW (ref 1.7–7.7)
Neutrophils Relative %: 42 %
Platelets: 144 10*3/uL — ABNORMAL LOW (ref 150–400)
RBC: 3.98 MIL/uL (ref 3.87–5.11)
RDW: 16.8 % — ABNORMAL HIGH (ref 11.5–15.5)
WBC: 1.5 10*3/uL — ABNORMAL LOW (ref 4.0–10.5)
nRBC: 0 % (ref 0.0–0.2)

## 2020-10-18 LAB — PROTIME-INR
INR: 1.1 (ref 0.8–1.2)
Prothrombin Time: 13.9 seconds (ref 11.4–15.2)

## 2020-10-18 MED ORDER — LIDOCAINE HCL 1 % IJ SOLN
INTRAMUSCULAR | Status: AC
  Filled 2020-10-18: qty 20

## 2020-10-18 MED ORDER — HEPARIN SOD (PORK) LOCK FLUSH 100 UNIT/ML IV SOLN
INTRAVENOUS | Status: AC
  Filled 2020-10-18: qty 5

## 2020-10-18 MED ORDER — MIDAZOLAM HCL 2 MG/2ML IJ SOLN
INTRAMUSCULAR | Status: AC | PRN
  Administered 2020-10-18 (×2): 1 mg via INTRAVENOUS

## 2020-10-18 MED ORDER — FENTANYL CITRATE (PF) 100 MCG/2ML IJ SOLN
INTRAMUSCULAR | Status: AC | PRN
  Administered 2020-10-18 (×2): 50 ug via INTRAVENOUS

## 2020-10-18 MED ORDER — CEFAZOLIN SODIUM-DEXTROSE 2-4 GM/100ML-% IV SOLN: 2.0000 g | INTRAVENOUS | Status: AC

## 2020-10-18 MED ORDER — MIDAZOLAM HCL 2 MG/2ML IJ SOLN
INTRAMUSCULAR | Status: AC
  Filled 2020-10-18: qty 4

## 2020-10-18 MED ORDER — FENTANYL CITRATE (PF) 100 MCG/2ML IJ SOLN
INTRAMUSCULAR | Status: AC
  Filled 2020-10-18: qty 2

## 2020-10-18 MED ORDER — CEFAZOLIN SODIUM-DEXTROSE 2-4 GM/100ML-% IV SOLN
INTRAVENOUS | Status: AC
  Administered 2020-10-18: 2 g via INTRAVENOUS
  Filled 2020-10-18: qty 100

## 2020-10-18 MED ORDER — SODIUM CHLORIDE 0.9 % IV SOLN: INTRAVENOUS | Status: DC

## 2020-10-27 ENCOUNTER — Inpatient Hospital Stay: Payer: Federal, State, Local not specified - PPO | Admitting: Hematology and Oncology

## 2020-10-27 ENCOUNTER — Inpatient Hospital Stay: Payer: Federal, State, Local not specified - PPO

## 2020-10-27 ENCOUNTER — Telehealth: Payer: Self-pay | Admitting: Oncology

## 2020-10-27 NOTE — Telephone Encounter (Signed)
Attempted to call Sandra Hunt regarding her missed appointments today.  There was no answer.  Called her son, Catalina Antigua, and he said she has passed away.  They had found her on the floor between her bedroom and the bathroom.  Expressed sympathy for his loss and advised him to let us know if they need anything.

## 2020-10-28 ENCOUNTER — Ambulatory Visit: Payer: Federal, State, Local not specified - PPO

## 2020-11-15 NOTE — Discharge Instructions (Signed)
Please call Interventional Radiology clinic [phone] with any questions or concerns.  You may remove your dressing and shower tomorrow.  DO NOT use EMLA cream for 2 weeks after port placement as this cream will remove surgical glue on your incision.   Implanted Port Insertion, Care After This sheet gives you information about how to care for yourself after your procedure. Your health care provider may also give you more specific instructions. If you have problems or questions, contact your health care provider. What can I expect after the procedure? After the procedure, it is common to have:  Discomfort at the port insertion site.  Bruising on the skin over the port. This should improve over 3-4 days. Follow these instructions at home: Port care  After your port is placed, you will get a manufacturer's information card. The card has information about your port. Keep this card with you at all times.  Take care of the port as told by your health care provider. Ask your health care provider if you or a family member can get training for taking care of the port at home. A home health care nurse may also take care of the port.  Make sure to remember what type of port you have. Incision care  Follow instructions from your health care provider about how to take care of your port insertion site. Make sure you: ? Wash your hands with soap and water before and after you change your bandage (dressing). If soap and water are not available, use hand sanitizer. ? Change your dressing as told by your health care provider. ? Leave stitches (sutures), skin glue, or adhesive strips in place. These skin closures may need to stay in place for 2 weeks or longer. If adhesive strip edges start to loosen and curl up, you may trim the loose edges. Do not remove adhesive strips completely unless your health care provider tells you to do that.  Check your port insertion site every day for signs of infection.  Check for: ? Redness, swelling, or pain. ? Fluid or blood. ? Warmth. ? Pus or a bad smell.      Activity  Return to your normal activities as told by your health care provider. Ask your health care provider what activities are safe for you.  Do not lift anything that is heavier than 10 lb (4.5 kg), or the limit that you are told, until your health care provider says that it is safe. General instructions  Take over-the-counter and prescription medicines only as told by your health care provider.  Do not take baths, swim, or use a hot tub until your health care provider approves. Ask your health care provider if you may take showers. You may only be allowed to take sponge baths.  Do not drive for 24 hours if you were given a sedative during your procedure.  Wear a medical alert bracelet in case of an emergency. This will tell any health care providers that you have a port.  Keep all follow-up visits as told by your health care provider. This is important. Contact a health care provider if:  You cannot flush your port with saline as directed, or you cannot draw blood from the port.  You have a fever or chills.  You have redness, swelling, or pain around your port insertion site.  You have fluid or blood coming from your port insertion site.  Your port insertion site feels warm to the touch.  You have pus or a   bad smell coming from the port insertion site. Get help right away if:  You have chest pain or shortness of breath.  You have bleeding from your port that you cannot control. Summary  Take care of the port as told by your health care provider. Keep the manufacturer's information card with you at all times.  Change your dressing as told by your health care provider.  Contact a health care provider if you have a fever or chills or if you have redness, swelling, or pain around your port insertion site.  Keep all follow-up visits as told by your health care  provider. This information is not intended to replace advice given to you by your health care provider. Make sure you discuss any questions you have with your health care provider. Document Revised: 04/01/2018 Document Reviewed: 04/01/2018 Elsevier Patient Education  2021 [employer]    Moderate Conscious Sedation, Adult, Care After This sheet gives you information about how to care for yourself after your procedure. Your health care provider may also give you more specific instructions. If you have problems or questions, contact your health care provider. What can I expect after the procedure? After the procedure, it is common to have:  Sleepiness for several hours.  Impaired judgment for several hours.  Difficulty with balance.  Vomiting if you eat too soon. Follow these instructions at home: For the time period you were told by your health care provider:  Rest.  Do not participate in activities where you could fall or become injured.  Do not drive or use machinery.  Do not drink alcohol.  Do not take sleeping pills or medicines that cause drowsiness.  Do not make important decisions or sign legal documents.  Do not take care of children on your own.      Eating and drinking  Follow the diet recommended by your health care provider.  Drink enough fluid to keep your urine pale yellow.  If you vomit: ? Drink water, juice, or soup when you can drink without vomiting. ? Make sure you have little or no nausea before eating solid foods.   General instructions  Take over-the-counter and prescription medicines only as told by your health care provider.  Have a responsible adult stay with you for the time you are told. It is important to have someone help care for you until you are awake and alert.  Do not smoke.  Keep all follow-up visits as told by your health care provider. This is important. Contact a health care provider if:  You are still sleepy or having  trouble with balance after 24 hours.  You feel light-headed.  You keep feeling nauseous or you keep vomiting.  You develop a rash.  You have a fever.  You have redness or swelling around the IV site. Get help right away if:  You have trouble breathing.  You have new-onset confusion at home. Summary  After the procedure, it is common to feel sleepy, have impaired judgment, or feel nauseous if you eat too soon.  Rest after you get home. Know the things you should not do after the procedure.  Follow the diet recommended by your health care provider and drink enough fluid to keep your urine pale yellow.  Get help right away if you have trouble breathing or new-onset confusion at home. This information is not intended to replace advice given to you by your health care provider. Make sure you discuss any questions you have with your health   care provider. Document Revised: 01/01/2020 Document Reviewed: 07/30/2019 Elsevier Patient Education  2021 Elsevier Inc.  

## 2020-11-15 NOTE — Telephone Encounter (Signed)
Scheduled appt per 1/28 sch msg - pt is aware of appt date and times

## 2020-11-15 NOTE — H&P (Signed)
Referring Physician(s): Heath Lark  Supervising Physician: Aletta Edouard  Patient Status:  [facility]  Chief Complaint:  "I'm here to get a port a cath"  Subjective: Patient familiar to IR service from paracentesis, omental caking biopsy and PICC placement on 10/05/20.  She has a history of metastatic endometrial carcinoma, poor venous access and presents again today for Port-A-Cath placement for chemotherapy.  She currently denies fever, headache, chest pain, cough, abdominal/back pain, nausea, vomiting or bleeding.  She does have some occasional dyspnea with exertion.  Past Medical History:  Diagnosis Date  . Allergy   . Asthma    allergic  . Cancer [facility])    endometrial cancer   . Depression   . Family history of kidney cancer   . Family history of leukemia   . Glaucoma   . Headache    migraines-but none in many years  . Hearing loss   . Hypertension   . PONV (postoperative nausea and vomiting)   . Pre-diabetes    Past Surgical History:  Procedure Laterality Date  . APPENDECTOMY    . EYE SURGERY     laser surgery for glaucoma-closed angle glaucoma  . IR PARACENTESIS  10/05/2020  . laporoscopy N/A   . ROBOTIC ASSISTED TOTAL HYSTERECTOMY WITH BILATERAL SALPINGO OOPHERECTOMY N/A 09/28/2019   Procedure: XI ROBOTIC ASSISTED TOTAL HYSTERECTOMY FOR UTERUS > THEN 250G WITH BILATERAL SALPINGO OOPHORECTOMY;  Surgeon: Everitt Amber, MD;  Location: [facility] ORS;  Service: Gynecology;  Laterality: N/A;  . SENTINEL NODE BIOPSY N/A 09/28/2019   Procedure: SENTINEL LYMPH NODE BIOPSY;  Surgeon: Everitt Amber, MD;  Location: [facility] ORS;  Service: Gynecology;  Laterality: N/A;  . TONSILLECTOMY Bilateral   . TUBAL LIGATION         Allergies: Other and Sulfacetamide sodium-sulfur  Medications: Prior to Admission medications   Medication Sig Start Date End Date Taking? Authorizing Provider  dexamethasone (DECADRON) 4 MG tablet Take 2 tabs at the night before and 2 tab the morning of  chemotherapy, every 3 weeks, by mouth x 6 cycles 10/04/20  Yes Gorsuch, Ni, MD  docusate sodium (COLACE) 100 MG capsule Take 100 mg by mouth daily.   Yes [provider]  montelukast (SINGULAIR) 10 MG tablet Take 10 mg by mouth daily. 08/15/20  Yes [provider]  oxyCODONE (ROXICODONE) 15 MG immediate release tablet Take 1 tablet (15 mg total) by mouth every 4 (four) hours as needed for severe pain. 10/04/20  Yes Gorsuch, Ni, MD  PROAIR RESPICLICK 409 (90 Base) MCG/ACT AEPB Inhale 1 puff into the lungs every 4 (four) hours as needed (wheezing/shortness of breath).  07/14/19  Yes [provider]  promethazine (PHENERGAN) 25 MG tablet Take 1 tablet (25 mg total) by mouth every 6 (six) hours as needed for nausea or vomiting. 10/04/20  Yes Gorsuch, Ni, MD  Lidocaine-Menthol (ICY HOT LIDOCAINE PLUS MENTHOL EX) Apply 1 application topically daily as needed (pain).    [provider]     Vital Signs: Vitals:   10/29/2020 1300 10/20/2020 1345  BP: (!) 154/126 (!) 156/88  Pulse: (!) 50 86  Resp: 18 20  Temp: 98.3 F (36.8 C) 98.3 F (36.8 C)  SpO2: 92% 99%      Physical Exam patient awake, alert.  Chest clear to auscultation bilaterally.  Heart with normal rate, occasional ectopy noted.  Abdomen soft, positive bowel sounds, currently nontender.  Extremities with full range of motion, some trace pretibial edema.  Imaging: No results found.  Labs:  CBC: Recent Labs    09/20/20 1432 10/04/20 1315 11/06/2020 1300  WBC 10.5 7.6 1.5*  HGB 13.4 13.4 11.3*  HCT 43.4 41.6 35.5*  PLT 553* 435* 144*    COAGS: Recent Labs    11/11/2020 1300  INR 1.1    BMP: Recent Labs    09/20/20 1432 10/04/20 1315  NA 137 142  K 5.3* 4.0  CL 99 106  CO2 26 21*  GLUCOSE 145* 127*  BUN 16 21  CALCIUM 9.6 9.5  CREATININE 0.85 1.04*  GFRNONAA >60 >60    LIVER FUNCTION TESTS: Recent Labs    09/20/20 1432 10/04/20 1315  BILITOT 0.5 0.3  AST 34 56*  ALT 28 14   ALKPHOS 263* 134*  PROT 8.2* 8.2*  ALBUMIN 3.4* 2.8*    Assessment and Plan: Patient familiar to IR service from paracentesis, omental caking biopsy and PICC placement on 10/05/20.  She has a history of metastatic endometrial carcinoma, poor venous access and presents again today for Port-A-Cath placement for chemotherapy.Risks and benefits of image guided port-a-catheter placement was discussed with the patient including, but not limited to bleeding, infection, pneumothorax, or fibrin sheath development and need for additional procedures.  All of the patient's questions were answered, patient is agreeable to proceed. Consent signed and in chart.  Dr. Alvy Bimler is aware of patient's WBC today of 1.5. Ok to proceed with port placement.   Electronically Signed: D. Rowe Robert, PA-C 11/01/2020, 1:51 PM   I spent a total of 25 minutes at the the patient's bedside AND on the patient's hospital floor or unit, greater than 50% of which was counseling/coordinating care for Port-A-Cath placement

## 2020-11-15 NOTE — Procedures (Signed)
Interventional Radiology Procedure Note  Procedure: Single Lumen Power Port Placement    Access:  Right IJ vein.  Findings: Catheter tip positioned at SVC/RA junction. Port is ready for immediate use.   Complications: None  EBL: < 10 mL  Recommendations:  - Ok to shower in 24 hours - Do not submerge for 7 days - Routine line care   Shaundra Fullam T. Kazimierz Springborn, M.D Pager:  [phone]   

## 2020-11-15 DEATH — deceased

## 2022-09-10 IMAGING — CT CT ABD-PELV W/ CM
2 of 5 series · 15 of 46 positions shown, 17 images · IV contrast (OMNIPAQUE 300)
Comparison: 07/30/2019

CLINICAL DATA: Abdominal distension for 1 month, history of
endometrial cancer

EXAM:
CT ABDOMEN AND PELVIS WITH CONTRAST
TECHNIQUE: Multidetector CT imaging of the abdomen and pelvis was performed
using the standard protocol following bolus administration of
intravenous contrast.
CONTRAST:  100mL OMNIPAQUE IOHEXOL 300 MG/ML  SOLN

[Series 2: axial st · axial · 0.76mm/px · z∈[+1052,+1482]mm · 12 of 100 slices shown, 14 images]
[im 7/100  soft-tissue]
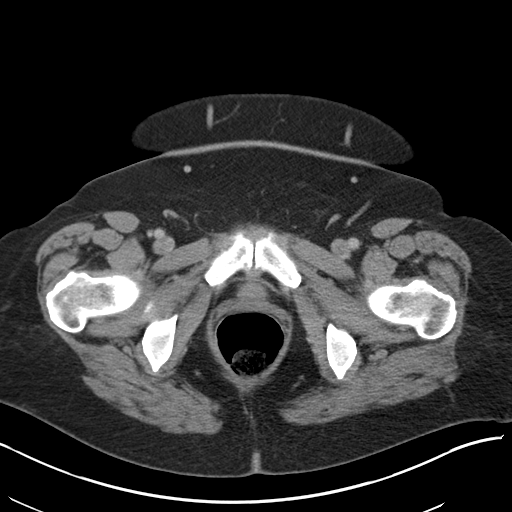
[im 7/100  bone]
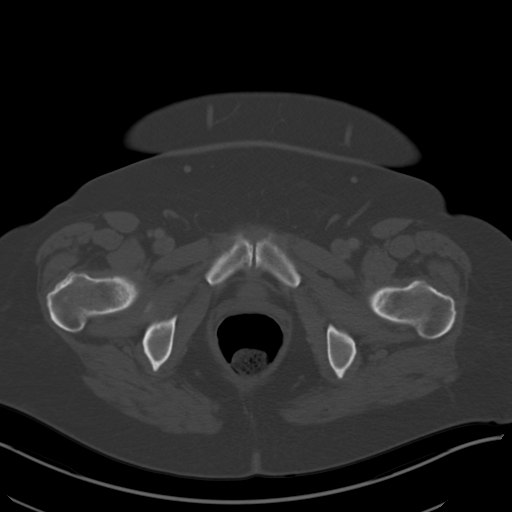
[im 14/100  soft-tissue]
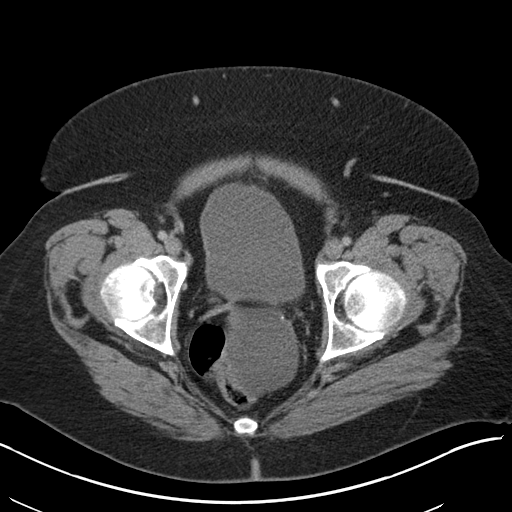
[im 20/100  soft-tissue]
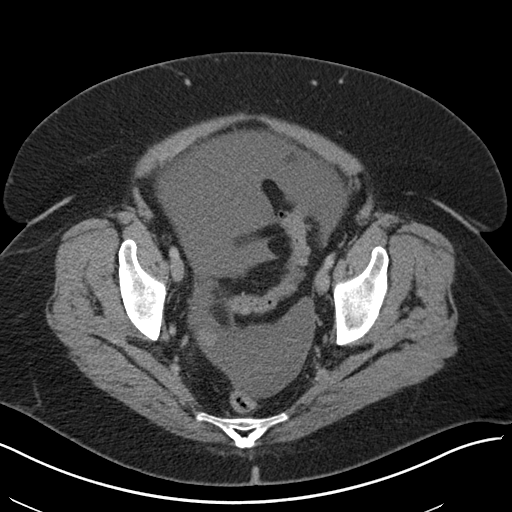
[im 34/100  soft-tissue]
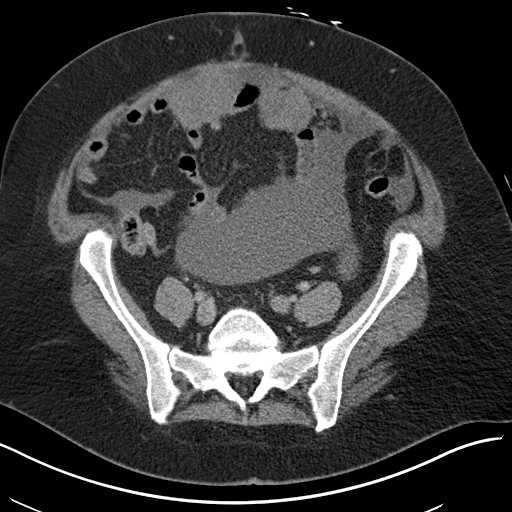
[im 40/100  soft-tissue]
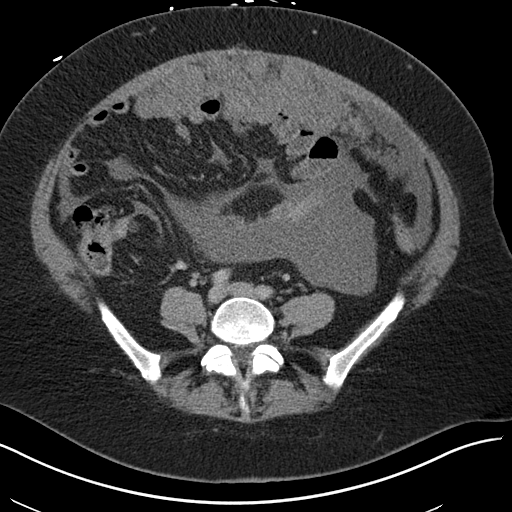
[im 47/100  soft-tissue]
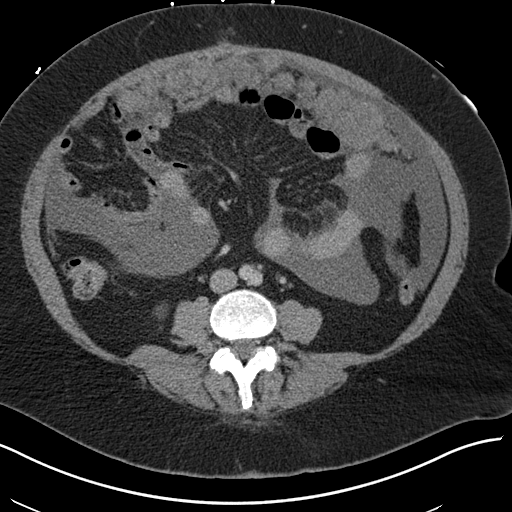
[im 53/100  soft-tissue]
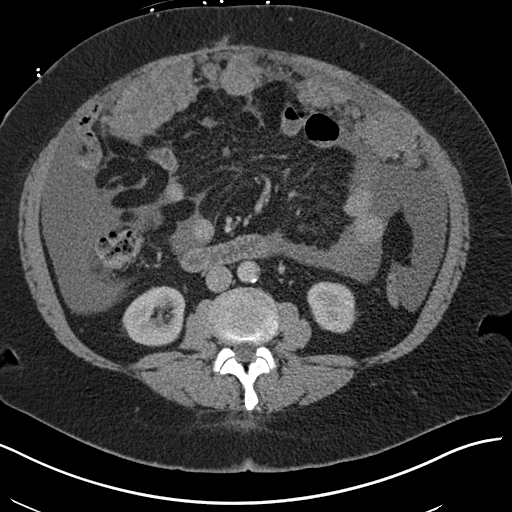
[im 60/100  soft-tissue]
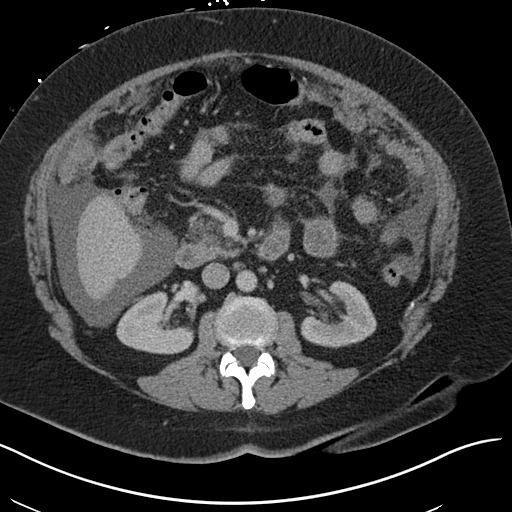
[im 67/100  soft-tissue]
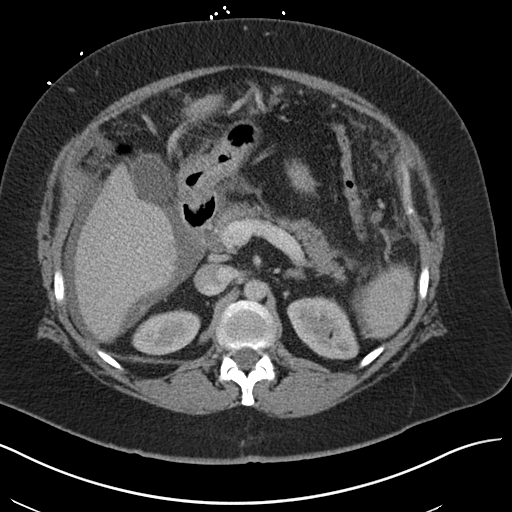
[im 67/100  bone]
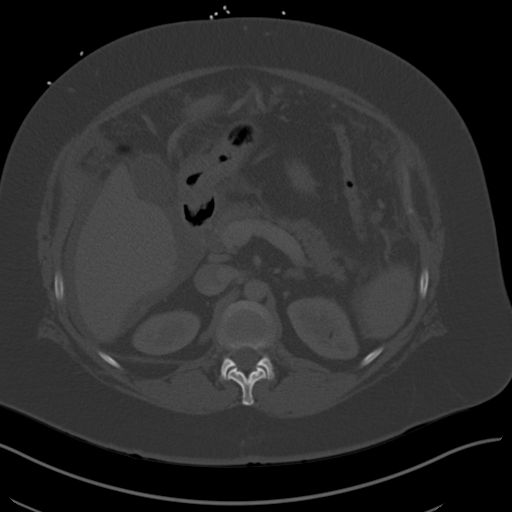
[im 80/100  soft-tissue]
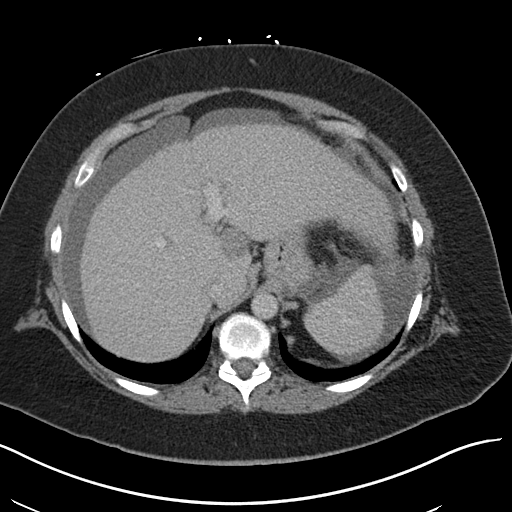
[im 86/100  soft-tissue]
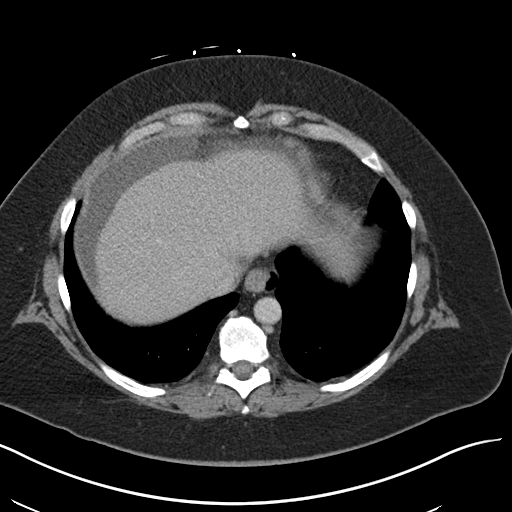
[im 93/100  soft-tissue]
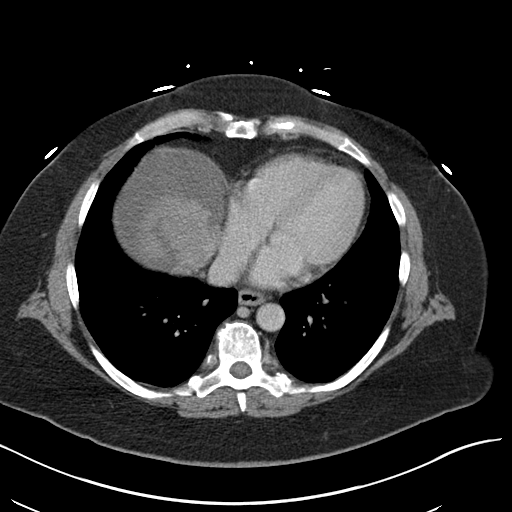

[Series 4: coronal st · coronal · 0.77mm/px · 3 of 168 slices shown]
[im 56/168  soft-tissue]
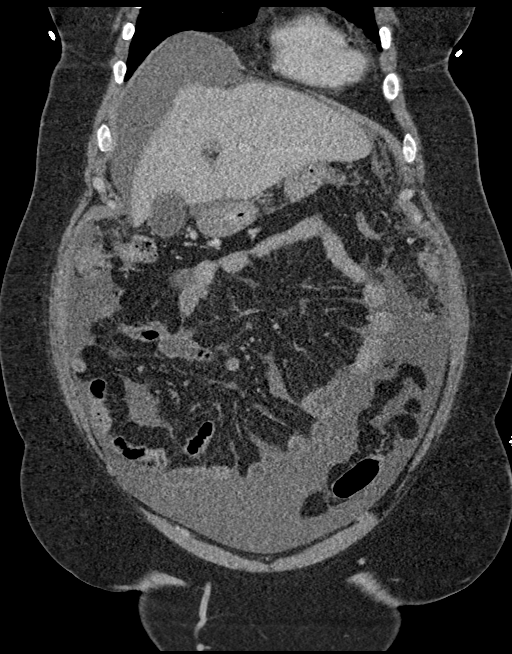
[im 75/168  soft-tissue]
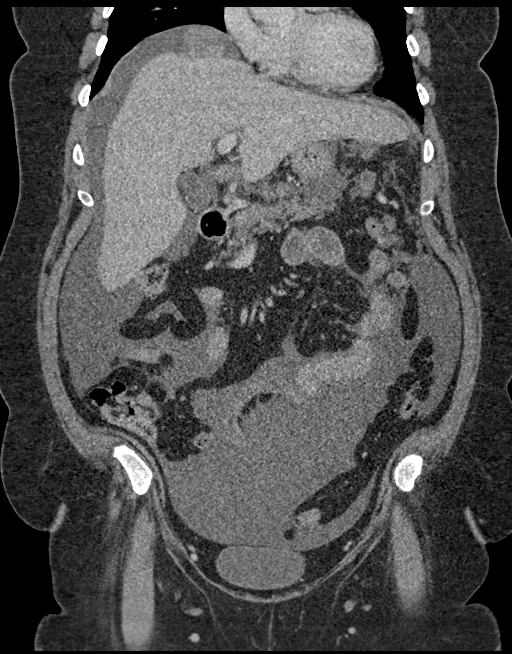
[im 93/168  soft-tissue]
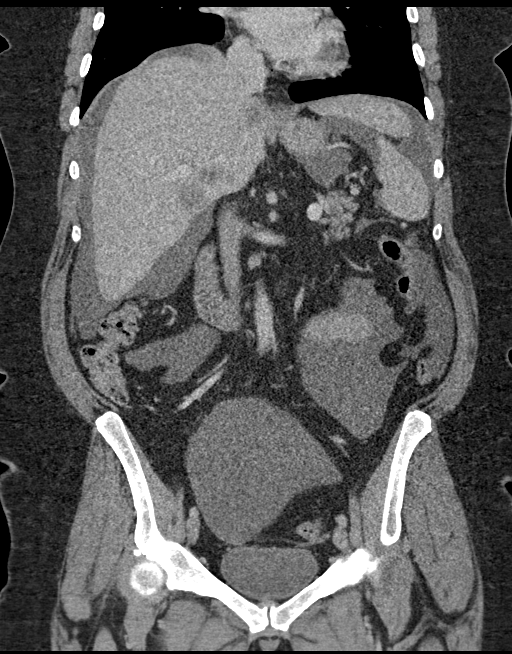

[15 of 46 positions shown; findings below may reference images not displayed]

FINDINGS: Lower chest: No acute pleural or parenchymal lung disease.

Hepatobiliary: Numerous serosal implants are seen along the liver,
consistent with metastatic disease in a patient with a history of
endometrial cancer. Largest of these inferior right lobe liver
measuring 3.3 x 2.9 cm. There are no focal liver parenchymal
abnormalities. The gallbladder is unremarkable.

Pancreas: Unremarkable. No pancreatic ductal dilatation or
surrounding inflammatory changes.

Spleen: Normal in size without focal abnormality.

Adrenals/Urinary Tract: Adrenal glands are unremarkable. Kidneys are
normal, without renal calculi, focal lesion, or hydronephrosis.
Bladder is unremarkable.

Stomach/Bowel: No bowel obstruction or ileus. The appendix is
surgically absent. No bowel wall thickening or inflammatory change.

Vascular/Lymphatic: Minimal atherosclerosis of the aorta. No
discrete adenopathy.

Reproductive: Uterus and adnexal structures are surgically absent.

Other: Soft tissue masses are seen throughout the mesentery,
consistent with metastatic disease and peritoneal carcinomatosis.
Omental caking in the ventral abdomen measures up to 5.1 cm in
thickness. Soft tissue nodules are seen within the lower pelvis as
well. There is free fluid throughout the abdomen and pelvis. No free
intraperitoneal gas. No abdominal wall hernia.

Musculoskeletal: No acute or destructive bony lesions. Bilateral L5
spondylolysis with grade 1 anterolisthesis of L5 on S1.
Reconstructed images demonstrate no additional findings.
IMPRESSION: 1. Findings of diffuse peritoneal carcinomatosis, with serosal
implants along the liver, omental caking, and multiple mesenteric
soft tissue nodules as above. Findings are compatible with
metastatic endometrial cancer.
2. Abdominal and pelvic ascites.
3.  Aortic Atherosclerosis ([id number]).
